# Patient Record
Sex: Female | Born: 1985 | Race: Black or African American | Hispanic: No | Marital: Single | Smoking: Former smoker
Health system: Southern US, Community
[De-identification: ages and names within clinical notes are randomized; demographics above are authoritative.]

## PROBLEM LIST (undated history)

## (undated) ENCOUNTER — Ambulatory Visit (HOSPITAL_COMMUNITY): Admission: EM | Payer: Medicaid Other | Source: Home / Self Care

## (undated) DIAGNOSIS — R519 Headache, unspecified: Secondary | ICD-10-CM

## (undated) DIAGNOSIS — D573 Sickle-cell trait: Secondary | ICD-10-CM

## (undated) DIAGNOSIS — Z5189 Encounter for other specified aftercare: Secondary | ICD-10-CM

## (undated) DIAGNOSIS — B009 Herpesviral infection, unspecified: Secondary | ICD-10-CM

## (undated) HISTORY — PX: NO PAST SURGERIES: SHX2092

---
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
## 2007-11-19 ENCOUNTER — Inpatient Hospital Stay (HOSPITAL_COMMUNITY): Admission: AD | Admit: 2007-11-19 | Discharge: 2007-11-26 | Payer: Self-pay | Admitting: Obstetrics & Gynecology

## 2007-11-19 ENCOUNTER — Ambulatory Visit: Payer: Self-pay | Admitting: Obstetrics & Gynecology

## 2007-11-24 ENCOUNTER — Encounter: Payer: Self-pay | Admitting: Obstetrics & Gynecology

## 2007-11-24 ENCOUNTER — Inpatient Hospital Stay (HOSPITAL_COMMUNITY): Admission: AD | Admit: 2007-11-24 | Discharge: 2007-11-26 | Payer: Self-pay | Admitting: Obstetrics and Gynecology

## 2007-11-24 ENCOUNTER — Ambulatory Visit: Payer: Self-pay | Admitting: Physician Assistant

## 2008-03-20 ENCOUNTER — Encounter: Admission: RE | Admit: 2008-03-20 | Discharge: 2008-03-20 | Payer: Self-pay | Admitting: Family Medicine

## 2008-10-20 ENCOUNTER — Emergency Department (HOSPITAL_COMMUNITY): Admission: EM | Admit: 2008-10-20 | Discharge: 2008-10-20 | Payer: Self-pay | Admitting: Family Medicine

## 2008-12-12 ENCOUNTER — Emergency Department (HOSPITAL_COMMUNITY): Admission: EM | Admit: 2008-12-12 | Discharge: 2008-12-12 | Payer: Self-pay | Admitting: Emergency Medicine

## 2008-12-22 ENCOUNTER — Emergency Department (HOSPITAL_COMMUNITY): Admission: EM | Admit: 2008-12-22 | Discharge: 2008-12-22 | Payer: Self-pay | Admitting: Emergency Medicine

## 2009-03-24 ENCOUNTER — Emergency Department (HOSPITAL_COMMUNITY): Admission: EM | Admit: 2009-03-24 | Discharge: 2009-03-24 | Payer: Self-pay | Admitting: Emergency Medicine

## 2009-07-25 ENCOUNTER — Emergency Department (HOSPITAL_COMMUNITY): Admission: EM | Admit: 2009-07-25 | Discharge: 2009-07-25 | Payer: Self-pay | Admitting: Family Medicine

## 2009-08-09 ENCOUNTER — Emergency Department (HOSPITAL_COMMUNITY): Admission: EM | Admit: 2009-08-09 | Discharge: 2009-08-09 | Payer: Self-pay | Admitting: Family Medicine

## 2009-08-30 ENCOUNTER — Emergency Department (HOSPITAL_COMMUNITY): Admission: EM | Admit: 2009-08-30 | Discharge: 2009-08-30 | Payer: Self-pay | Admitting: Family Medicine

## 2009-10-20 ENCOUNTER — Emergency Department (HOSPITAL_COMMUNITY): Admission: EM | Admit: 2009-10-20 | Discharge: 2009-10-20 | Payer: Self-pay | Admitting: Emergency Medicine

## 2009-10-23 ENCOUNTER — Inpatient Hospital Stay (HOSPITAL_COMMUNITY): Admission: EM | Admit: 2009-10-23 | Discharge: 2009-10-24 | Payer: Self-pay | Admitting: Emergency Medicine

## 2009-11-17 ENCOUNTER — Other Ambulatory Visit: Admission: RE | Admit: 2009-11-17 | Discharge: 2009-11-17 | Payer: Self-pay | Admitting: Family Medicine

## 2009-12-25 ENCOUNTER — Emergency Department (HOSPITAL_BASED_OUTPATIENT_CLINIC_OR_DEPARTMENT_OTHER): Admission: EM | Admit: 2009-12-25 | Discharge: 2009-12-25 | Payer: Self-pay | Admitting: Emergency Medicine

## 2010-04-03 ENCOUNTER — Emergency Department (HOSPITAL_COMMUNITY): Admission: EM | Admit: 2010-04-03 | Discharge: 2010-04-03 | Payer: Self-pay | Admitting: Family Medicine

## 2010-06-16 ENCOUNTER — Emergency Department (HOSPITAL_COMMUNITY)
Admission: EM | Admit: 2010-06-16 | Discharge: 2010-06-16 | Payer: Self-pay | Source: Home / Self Care | Admitting: Family Medicine

## 2010-08-07 ENCOUNTER — Emergency Department (HOSPITAL_COMMUNITY): Payer: Medicaid Other

## 2010-08-07 ENCOUNTER — Encounter (HOSPITAL_COMMUNITY): Payer: Self-pay | Admitting: Radiology

## 2010-08-07 ENCOUNTER — Emergency Department (HOSPITAL_COMMUNITY)
Admission: EM | Admit: 2010-08-07 | Discharge: 2010-08-07 | Disposition: A | Discharge status: Home / Self Care | Payer: Medicaid Other | Attending: Emergency Medicine | Admitting: Emergency Medicine

## 2010-08-07 DIAGNOSIS — R0602 Shortness of breath: Secondary | ICD-10-CM | POA: Insufficient documentation

## 2010-08-07 DIAGNOSIS — R1011 Right upper quadrant pain: Secondary | ICD-10-CM | POA: Insufficient documentation

## 2010-08-07 DIAGNOSIS — N739 Female pelvic inflammatory disease, unspecified: Secondary | ICD-10-CM | POA: Insufficient documentation

## 2010-08-07 DIAGNOSIS — R52 Pain, unspecified: Secondary | ICD-10-CM

## 2010-08-07 DIAGNOSIS — K589 Irritable bowel syndrome without diarrhea: Secondary | ICD-10-CM | POA: Insufficient documentation

## 2010-08-07 DIAGNOSIS — N83209 Unspecified ovarian cyst, unspecified side: Secondary | ICD-10-CM | POA: Insufficient documentation

## 2010-08-07 DIAGNOSIS — M083 Juvenile rheumatoid polyarthritis (seronegative): Secondary | ICD-10-CM | POA: Insufficient documentation

## 2010-08-07 LAB — URINALYSIS, ROUTINE W REFLEX MICROSCOPIC
Glucose, UA: NEGATIVE mg/dL
Protein, ur: NEGATIVE mg/dL
Specific Gravity, Urine: 1.015 (ref 1.005–1.030)
Urobilinogen, UA: 0.2 mg/dL (ref 0.0–1.0)
pH: 7 (ref 5.0–8.0)

## 2010-08-07 LAB — DIFFERENTIAL
Basophils Absolute: 0 10*3/uL (ref 0.0–0.1)
Basophils Relative: 0 % (ref 0–1)
Eosinophils Absolute: 0 10*3/uL (ref 0.0–0.7)
Neutro Abs: 6.6 10*3/uL (ref 1.7–7.7)
Neutrophils Relative %: 74 % (ref 43–77)

## 2010-08-07 LAB — COMPREHENSIVE METABOLIC PANEL
ALT: <8 U/L (ref 0–35)
AST: 24 U/L (ref 0–37)
BUN: 7 mg/dL (ref 6–23)
CO2: 27 mEq/L (ref 19–32)
Chloride: 104 mEq/L (ref 96–112)
Creatinine, Ser: 0.61 mg/dL (ref 0.4–1.2)
GFR calc Af Amer: >60 mL/min (ref >60)
Sodium: 138 mEq/L (ref 135–145)
Total Protein: 7.3 g/dL (ref 6.0–8.3)

## 2010-08-07 LAB — CBC
HCT: 35 % — ABNORMAL LOW (ref 36.0–46.0)
MCH: 29.9 pg (ref 26.0–34.0)
MCHC: 34.3 g/dL (ref 30.0–36.0)
Platelets: 222 10*3/uL (ref 150–400)
RBC: 4.01 MIL/uL (ref 3.87–5.11)
RDW: 13 % (ref 11.5–15.5)

## 2010-08-07 LAB — URINE MICROSCOPIC-ADD ON

## 2010-08-07 LAB — POCT PREGNANCY, URINE: Preg Test, Ur: NEGATIVE

## 2010-08-07 LAB — LIPASE, BLOOD: Lipase: 25 U/L (ref 11–59)

## 2010-08-07 MED ORDER — IOHEXOL 300 MG/ML  SOLN: 80.0000 mL | Freq: Once | INTRAMUSCULAR | Status: DC | PRN

## 2010-08-11 LAB — POCT URINALYSIS DIPSTICK
Nitrite: NEGATIVE
Specific Gravity, Urine: 1.015 (ref 1.005–1.030)
Urobilinogen, UA: 0.2 mg/dL (ref 0.0–1.0)
pH: 7 (ref 5.0–8.0)

## 2010-08-17 LAB — CBC
HCT: 36.9 % (ref 36.0–46.0)
MCHC: 33.2 g/dL (ref 30.0–36.0)
MCHC: 33.7 g/dL (ref 30.0–36.0)
MCV: 90.9 fL (ref 78.0–100.0)
MCV: 91 fL (ref 78.0–100.0)
Platelets: 191 10*3/uL (ref 150–400)
RBC: 3.46 MIL/uL — ABNORMAL LOW (ref 3.87–5.11)
RBC: 4.05 MIL/uL (ref 3.87–5.11)
RDW: 14.2 % (ref 11.5–15.5)
WBC: 10 10*3/uL (ref 4.0–10.5)
WBC: 9.4 10*3/uL (ref 4.0–10.5)

## 2010-08-17 LAB — DIFFERENTIAL
Basophils Relative: 0 % (ref 0–1)
Eosinophils Absolute: 0 10*3/uL (ref 0.0–0.7)
Eosinophils Relative: 0 % (ref 0–5)
Lymphocytes Relative: 18 % (ref 12–46)
Lymphs Abs: 1 10*3/uL (ref 0.7–4.0)
Lymphs Abs: 1.4 10*3/uL (ref 0.7–4.0)
Monocytes Absolute: 0.4 10*3/uL (ref 0.1–1.0)
Monocytes Relative: 5 % (ref 3–12)
Monocytes Relative: 9 % (ref 3–12)
Neutro Abs: 5.9 10*3/uL (ref 1.7–7.7)
Neutrophils Relative %: 76 % (ref 43–77)

## 2010-08-17 LAB — COMPREHENSIVE METABOLIC PANEL
Albumin: 3.5 g/dL (ref 3.5–5.2)
BUN: 9 mg/dL (ref 6–23)
Calcium: 9.1 mg/dL (ref 8.4–10.5)
Creatinine, Ser: 0.67 mg/dL (ref 0.4–1.2)
Glucose, Bld: 90 mg/dL (ref 70–99)
Total Protein: 7.2 g/dL (ref 6.0–8.3)

## 2010-08-17 LAB — BASIC METABOLIC PANEL
Calcium: 8.5 mg/dL (ref 8.4–10.5)
Chloride: 109 mEq/L (ref 96–112)
Creatinine, Ser: 0.57 mg/dL (ref 0.4–1.2)
GFR calc Af Amer: >60 mL/min (ref >60)
GFR calc Af Amer: >60 mL/min (ref >60)
GFR calc non Af Amer: >60 mL/min (ref >60)
Sodium: 140 mEq/L (ref 135–145)

## 2010-08-17 LAB — CULTURE, BLOOD (ROUTINE X 2): Culture: NO GROWTH

## 2010-08-17 LAB — HEPATIC FUNCTION PANEL
ALT: 12 U/L (ref 0–35)
AST: 16 U/L (ref 0–37)
Albumin: 2.6 g/dL — ABNORMAL LOW (ref 3.5–5.2)
Alkaline Phosphatase: 49 U/L (ref 39–117)
Indirect Bilirubin: 0.3 mg/dL (ref 0.3–0.9)
Total Protein: 6.1 g/dL (ref 6.0–8.3)

## 2010-08-17 LAB — WET PREP, GENITAL
Trich, Wet Prep: NONE SEEN
Yeast Wet Prep HPF POC: NONE SEEN

## 2010-08-17 LAB — URINALYSIS, ROUTINE W REFLEX MICROSCOPIC
Bilirubin Urine: NEGATIVE
Glucose, UA: NEGATIVE mg/dL
Glucose, UA: NEGATIVE mg/dL
Ketones, ur: NEGATIVE mg/dL
Ketones, ur: NEGATIVE mg/dL
Nitrite: NEGATIVE
Specific Gravity, Urine: 1.018 (ref 1.005–1.030)
pH: 6 (ref 5.0–8.0)
pH: 6.5 (ref 5.0–8.0)

## 2010-08-17 LAB — URINE MICROSCOPIC-ADD ON

## 2010-08-17 LAB — POCT I-STAT, CHEM 8
BUN: 3 mg/dL — ABNORMAL LOW (ref 6–23)
Calcium, Ion: 1.15 mmol/L (ref 1.12–1.32)
Creatinine, Ser: 0.7 mg/dL (ref 0.4–1.2)
Sodium: 139 mEq/L (ref 135–145)
TCO2: 27 mmol/L (ref 0–100)

## 2010-08-17 LAB — D-DIMER, QUANTITATIVE: D-Dimer, Quant: 4.21 ug/mL-FEU — ABNORMAL HIGH (ref 0.00–0.48)

## 2010-08-17 LAB — RAPID URINE DRUG SCREEN, HOSP PERFORMED
Barbiturates: NOT DETECTED
Cocaine: NOT DETECTED

## 2010-08-17 LAB — URINE CULTURE: Culture: NO GROWTH

## 2010-08-17 LAB — PREGNANCY, URINE: Preg Test, Ur: NEGATIVE

## 2010-08-19 LAB — POCT URINALYSIS DIP (DEVICE)
Bilirubin Urine: NEGATIVE
Hgb urine dipstick: NEGATIVE
Ketones, ur: NEGATIVE mg/dL
Protein, ur: 30 mg/dL — AB
Specific Gravity, Urine: 1.01 (ref 1.005–1.030)
pH: 8.5 — ABNORMAL HIGH (ref 5.0–8.0)

## 2010-08-23 LAB — CBC
HCT: 38.8 % (ref 36.0–46.0)
Hemoglobin: 13.1 g/dL (ref 12.0–15.0)
MCV: 91.7 fL (ref 78.0–100.0)
RBC: 4.22 MIL/uL (ref 3.87–5.11)
WBC: 6.5 10*3/uL (ref 4.0–10.5)

## 2010-08-23 LAB — DIFFERENTIAL
Eosinophils Absolute: 0.1 10*3/uL (ref 0.0–0.7)
Eosinophils Relative: 1 % (ref 0–5)
Lymphs Abs: 1.5 10*3/uL (ref 0.7–4.0)
Monocytes Absolute: 0.6 10*3/uL (ref 0.1–1.0)
Monocytes Relative: 10 % (ref 3–12)
Neutrophils Relative %: 66 % (ref 43–77)

## 2010-09-03 LAB — URINALYSIS, ROUTINE W REFLEX MICROSCOPIC
Bilirubin Urine: NEGATIVE
Glucose, UA: NEGATIVE mg/dL
Protein, ur: NEGATIVE mg/dL

## 2010-09-03 LAB — URINE CULTURE

## 2010-09-03 LAB — URINE MICROSCOPIC-ADD ON

## 2010-09-03 LAB — POCT PREGNANCY, URINE: Preg Test, Ur: NEGATIVE

## 2010-09-06 LAB — URINALYSIS, ROUTINE W REFLEX MICROSCOPIC
Bilirubin Urine: NEGATIVE
Glucose, UA: NEGATIVE mg/dL
Ketones, ur: NEGATIVE mg/dL
Nitrite: NEGATIVE
Protein, ur: NEGATIVE mg/dL
Specific Gravity, Urine: 1.013 (ref 1.005–1.030)
Urobilinogen, UA: 0.2 mg/dL (ref 0.0–1.0)
pH: 6.5 (ref 5.0–8.0)

## 2010-09-06 LAB — WET PREP, GENITAL
Trich, Wet Prep: NONE SEEN
Yeast Wet Prep HPF POC: NONE SEEN

## 2010-09-06 LAB — RPR: RPR Ser Ql: NONREACTIVE

## 2010-09-06 LAB — DIFFERENTIAL
Basophils Absolute: 0.1 10*3/uL (ref 0.0–0.1)
Basophils Relative: 1 % (ref 0–1)
Eosinophils Absolute: 0 10*3/uL (ref 0.0–0.7)
Monocytes Absolute: 0.5 10*3/uL (ref 0.1–1.0)
Neutro Abs: 9.9 10*3/uL — ABNORMAL HIGH (ref 1.7–7.7)
Neutrophils Relative %: 89 % — ABNORMAL HIGH (ref 43–77)

## 2010-09-06 LAB — COMPREHENSIVE METABOLIC PANEL
Albumin: 3.7 g/dL (ref 3.5–5.2)
Alkaline Phosphatase: 39 U/L (ref 39–117)
BUN: 7 mg/dL (ref 6–23)
Chloride: 109 mEq/L (ref 96–112)
Glucose, Bld: 104 mg/dL — ABNORMAL HIGH (ref 70–99)
Potassium: 4.3 mEq/L (ref 3.5–5.1)
Total Bilirubin: 0.5 mg/dL (ref 0.3–1.2)

## 2010-09-06 LAB — GC/CHLAMYDIA PROBE AMP, GENITAL
Chlamydia, DNA Probe: NEGATIVE
GC Probe Amp, Genital: NEGATIVE

## 2010-09-06 LAB — CBC
HCT: 38.8 % (ref 36.0–46.0)
Hemoglobin: 13.2 g/dL (ref 12.0–15.0)
WBC: 11.2 10*3/uL — ABNORMAL HIGH (ref 4.0–10.5)

## 2010-09-06 LAB — URINE MICROSCOPIC-ADD ON

## 2010-09-06 LAB — POCT PREGNANCY, URINE: Preg Test, Ur: NEGATIVE

## 2010-09-08 LAB — POCT PREGNANCY, URINE: Preg Test, Ur: NEGATIVE

## 2010-09-08 LAB — HERPES SIMPLEX VIRUS CULTURE: Culture: DETECTED

## 2010-09-08 LAB — POCT URINALYSIS DIP (DEVICE)
Hgb urine dipstick: NEGATIVE
Protein, ur: NEGATIVE mg/dL
Specific Gravity, Urine: 1.015 (ref 1.005–1.030)
Urobilinogen, UA: 0.2 mg/dL (ref 0.0–1.0)
pH: 8.5 — ABNORMAL HIGH (ref 5.0–8.0)

## 2010-09-08 LAB — URINE CULTURE

## 2011-02-25 LAB — CBC
Hemoglobin: 10.4 — ABNORMAL LOW
MCHC: 34.3
Platelets: 160
RBC: 4.14
RDW: 13.9
WBC: 14.5 — ABNORMAL HIGH

## 2011-02-25 LAB — STREP B DNA PROBE: Strep Group B Ag: NEGATIVE

## 2011-02-25 LAB — RAPID HIV SCREEN (WH-MAU): Rapid HIV Screen: NONREACTIVE

## 2011-02-25 LAB — RPR: RPR Ser Ql: NONREACTIVE

## 2011-03-11 IMAGING — CT CT ABD-PELV W/ CM
1 of 9 series · 14 of 37 positions shown, 18 images · IV contrast (APPLIED)
Comparison: Ultrasound of abdomen of 08/07/2010.

CLINICAL DATA: Abdominal pain.  Nausea.  Joint pain.  History of
sickle cell anemia.  Rheumatoid arthritis.

CT ANGIOGRAPHY OF THE CHEST
TECHNIQUE: Multidetector CT angiography of the chest was performed
after contrast with bolus timed to evaluate the pulmonary arteries.
Multiplanar CT image reconstructions including MIPs were obtained
to evaluate the vascular anatomy.
Contrast:  80 ml Umnipaque-EVV
TECHNIQUE: Multidetector CT imaging of the abdomen and pelvis was
performed following the standard protocol following the bolus
administration of intravenous contrast.

[Series 8: pulm embolism 1.0 b25f thins · axial · 0.54mm/px · z∈[-198,-12]mm · 14 of 216 slices shown, 18 images]
[im 15/216  mediastinal]
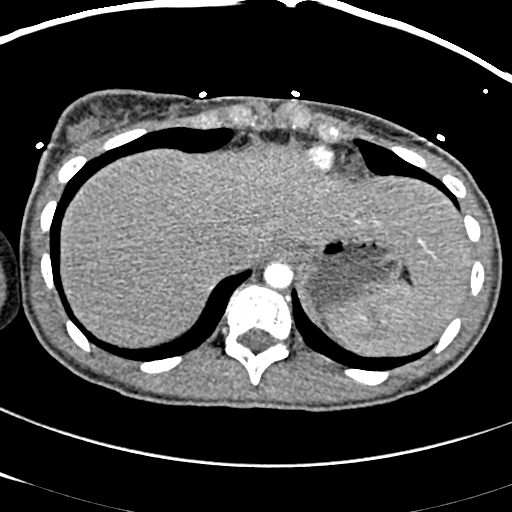
[im 15/216  lung]
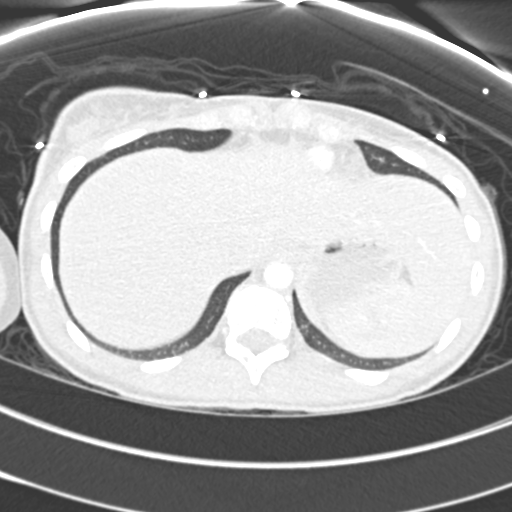
[im 29/216  lung]
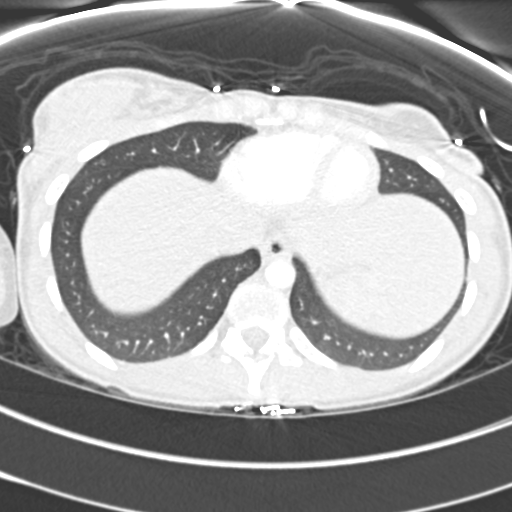
[im 44/216  lung]
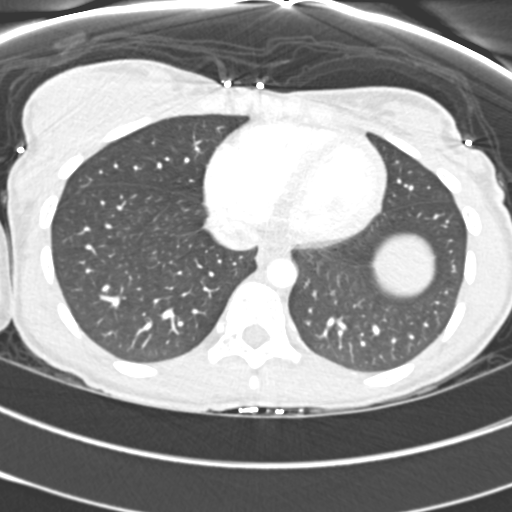
[im 58/216  lung]
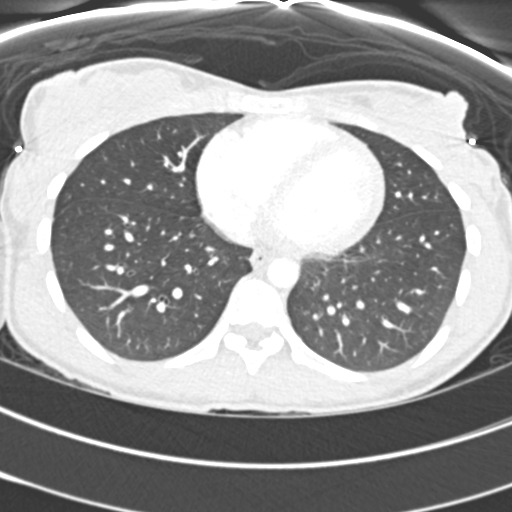
[im 72/216  mediastinal]
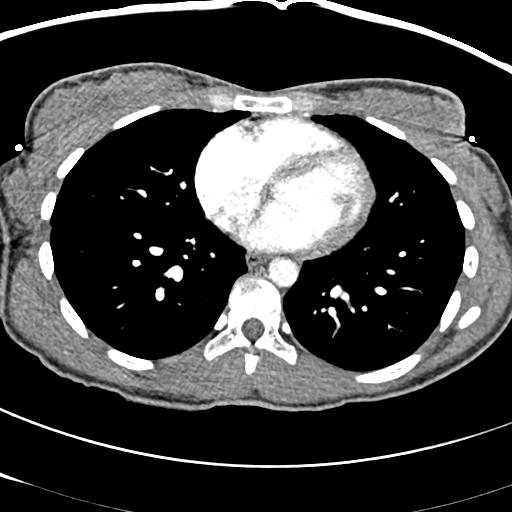
[im 72/216  lung]
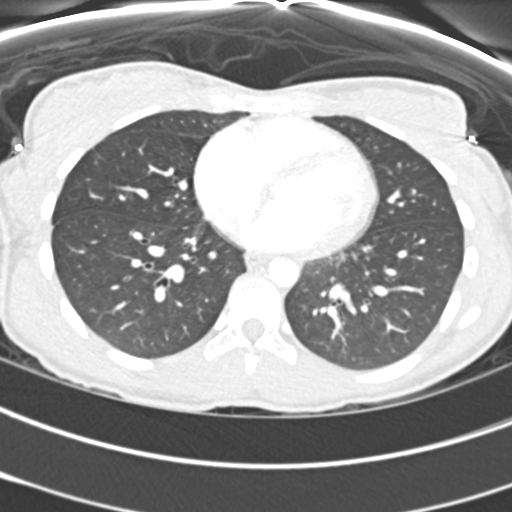
[im 87/216  lung]
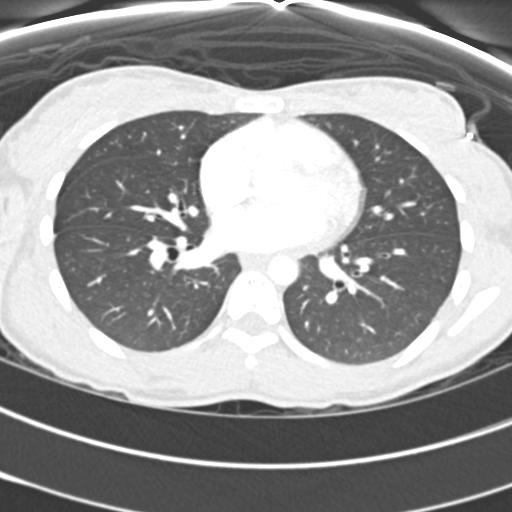
[im 101/216  lung]
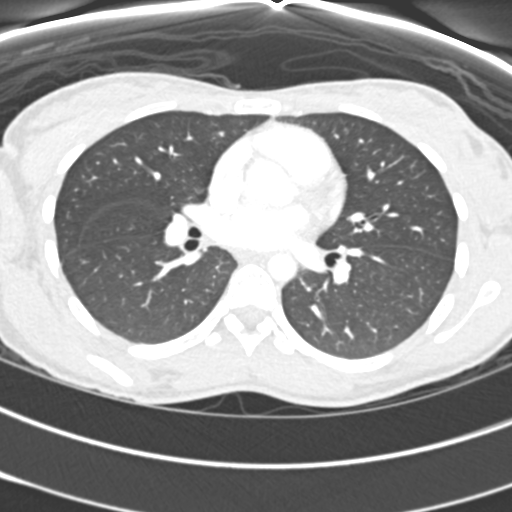
[im 115/216  lung]
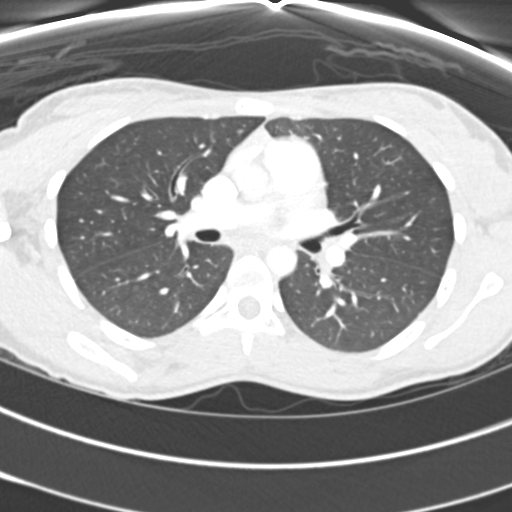
[im 130/216  mediastinal]
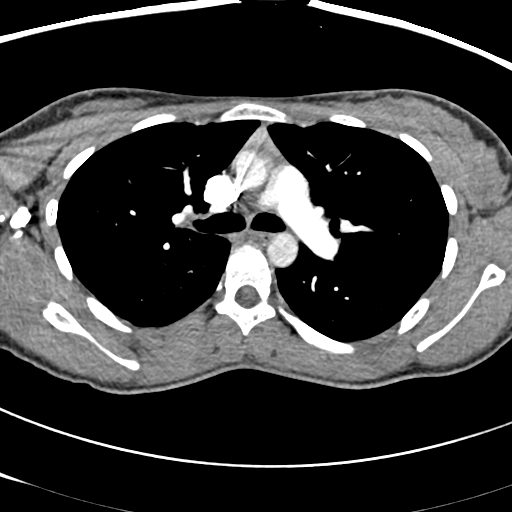
[im 130/216  lung]
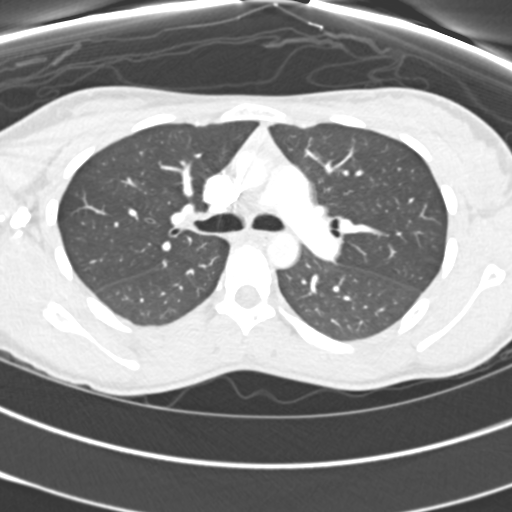
[im 144/216  lung]
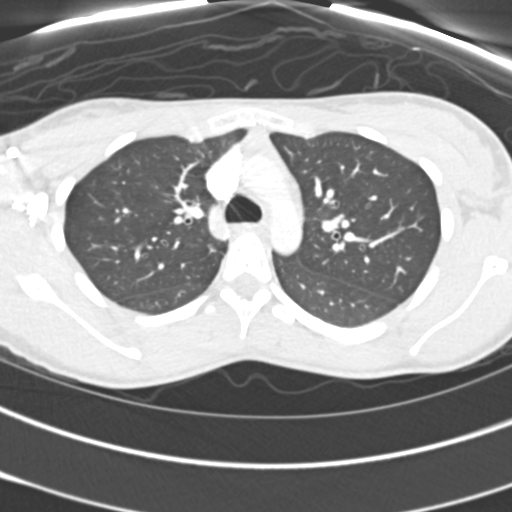
[im 158/216  lung]
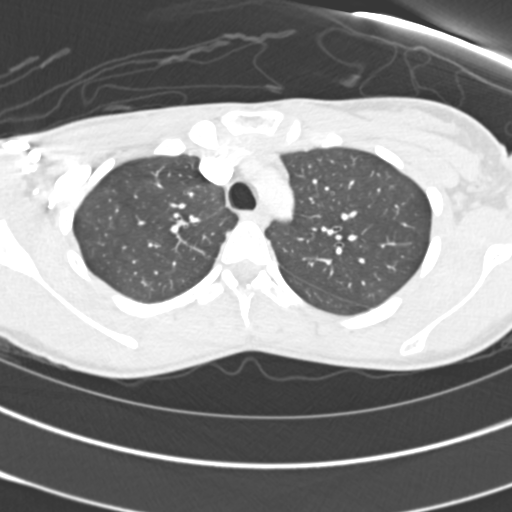
[im 173/216  lung]
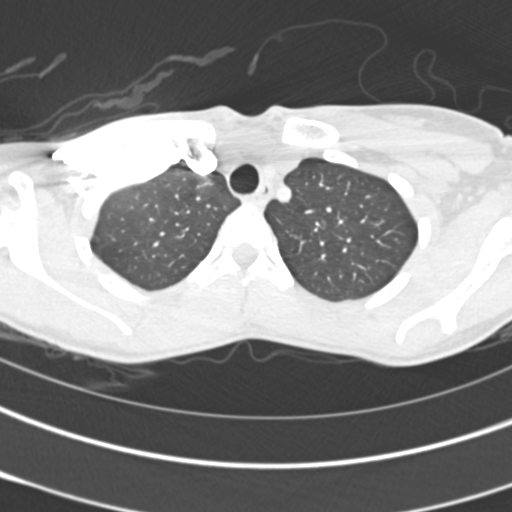
[im 187/216  mediastinal]
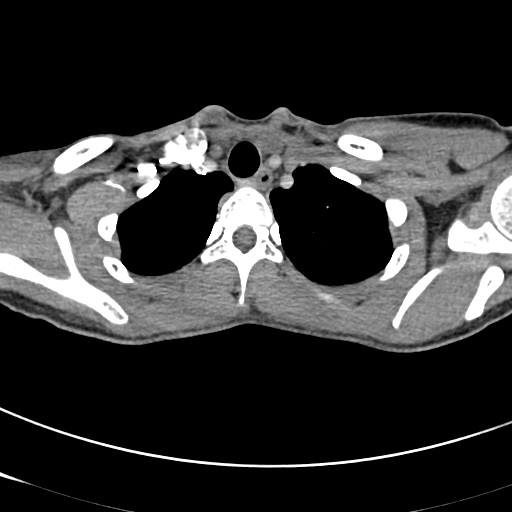
[im 187/216  lung]
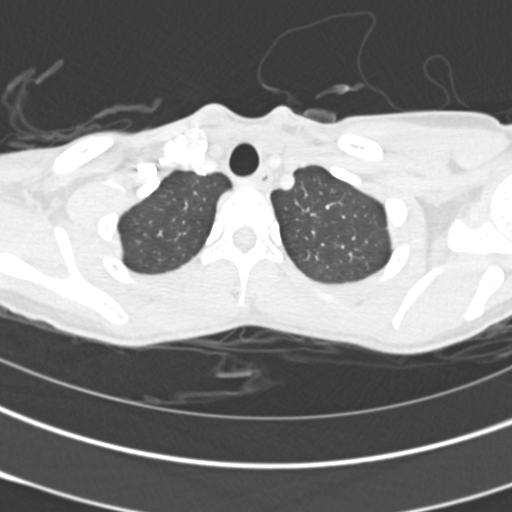
[im 201/216  lung]
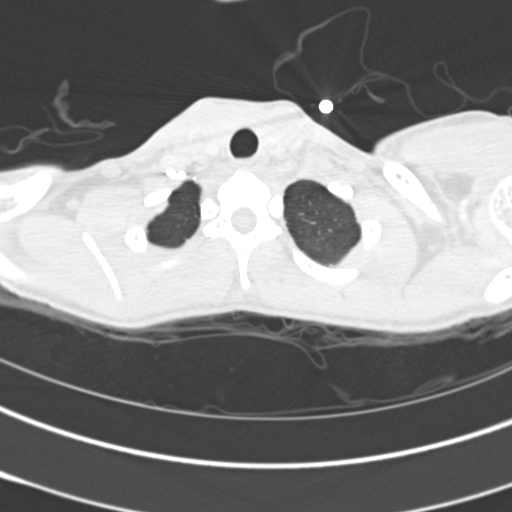

[14 of 37 positions shown; findings below may reference images not displayed]

Plain film chest
08/07/2010.  Chest CT 10/23/2009.  Abdominal pelvic CT 10/20/2009.
MRCP 10/23/2009.
FINDINGS: Lung windows demonstrate no nodules or airspace
opacities.

Soft tissue windows:  The quality of this exam for evaluation of
pulmonary embolism is good to excellent. No evidence of pulmonary
embolism.

Normal aortic caliber without dissection.  Normal heart size
without pericardial or pleural effusion.  No mediastinal or hilar
adenopathy.  Residual thymus in the anterior mediastinum. No acute
osseous abnormality.

 Review of the MIP images confirms the above findings.
IMPRESSION: 1. No evidence of pulmonary embolism.
2. No acute process in the chest.

CT ABDOMEN AND PELVIS WITH CONTRAST
FINDINGS: Mildly degraded by patient arm positioning.  Not raised
above the head.  Variant lateral segment left liver lobe extending
into the left upper quadrant.  Normal spleen, stomach, pancreas,
gallbladder, biliary tract, adrenal glands.  Early contrast
excretion within bilateral kidneys. No retroperitoneal or
retrocrural adenopathy.

Normal caliber of the colon.  Normal terminal ileum.  The appendix
is not well evaluated.  Poor enteric contrast opacification.

Normal caliber of small bowel loops. No pelvic adenopathy.  Normal
urinary bladder and uterus.  A right ovarian corpus luteal cyst
measures 1.9 cm on image 68.  There is soft tissue fullness within
the left hemi pelvis on image 61.  This is positioned immediately
inferior to the sigmoid colon.  This is favored to be related to
the left ovary.  There is mild edema within the anterior left
pelvic fat.  Example image 61.  This is new since the prior CT.

Trace free pelvic fluid is likely physiologic.  No acute osseous
abnormality.
IMPRESSION: 1.  Right ovarian corpus luteal cyst.
2.  Nonspecific edema and soft tissue fullness within the left hemi
pelvis.  Favor inflammation, as can be seen with pelvic
inflammatory disease.  Sigmoid diverticulitis is felt less likely
but could look similar.  Correlate with risk factors for pelvic
inflammatory disease and consider physical exam correlation.
3.  Suboptimal visualization of the appendix due to technique
related factors.

## 2011-05-04 ENCOUNTER — Emergency Department (HOSPITAL_COMMUNITY)
Admission: EM | Admit: 2011-05-04 | Discharge: 2011-05-04 | Discharge status: Against Medical Advice | Payer: Self-pay | Attending: Emergency Medicine | Admitting: Emergency Medicine

## 2011-05-04 ENCOUNTER — Encounter (HOSPITAL_COMMUNITY): Payer: Self-pay | Admitting: *Deleted

## 2011-05-04 DIAGNOSIS — R197 Diarrhea, unspecified: Secondary | ICD-10-CM | POA: Insufficient documentation

## 2011-05-04 DIAGNOSIS — M549 Dorsalgia, unspecified: Secondary | ICD-10-CM | POA: Insufficient documentation

## 2011-05-04 DIAGNOSIS — R112 Nausea with vomiting, unspecified: Secondary | ICD-10-CM | POA: Insufficient documentation

## 2011-05-04 DIAGNOSIS — R6889 Other general symptoms and signs: Secondary | ICD-10-CM | POA: Insufficient documentation

## 2011-05-04 DIAGNOSIS — R52 Pain, unspecified: Secondary | ICD-10-CM | POA: Insufficient documentation

## 2011-05-04 NOTE — ED Notes (Signed)
Pt states "I started with a tickle in my throat on Sunday, but I hurt all over, my back hurts, n/v/d."

## 2011-05-04 NOTE — ED Notes (Signed)
Pt does not want to wait for a room, left before signing AMA, pt was encouraged to wait for [facility] room

## 2011-11-28 ENCOUNTER — Emergency Department (HOSPITAL_COMMUNITY)
Admission: EM | Admit: 2011-11-28 | Discharge: 2011-11-29 | Disposition: A | Discharge status: Home / Self Care | Payer: Medicaid Other | Attending: Emergency Medicine | Admitting: Emergency Medicine

## 2011-11-28 ENCOUNTER — Encounter (HOSPITAL_COMMUNITY): Payer: Self-pay | Admitting: Emergency Medicine

## 2011-11-28 DIAGNOSIS — F17200 Nicotine dependence, unspecified, uncomplicated: Secondary | ICD-10-CM | POA: Insufficient documentation

## 2011-11-28 DIAGNOSIS — G43909 Migraine, unspecified, not intractable, without status migrainosus: Secondary | ICD-10-CM

## 2011-11-28 HISTORY — DX: Sickle-cell trait: D57.3

## 2011-11-28 MED ORDER — DIPHENHYDRAMINE HCL 50 MG/ML IJ SOLN
25.0000 mg | Freq: Once | INTRAMUSCULAR | Status: AC
  Administered 2011-11-28: 25 mg via INTRAVENOUS
  Filled 2011-11-28: qty 1

## 2011-11-28 MED ORDER — SUMATRIPTAN SUCCINATE 50 MG PO TABS: 50.0000 mg | ORAL_TABLET | ORAL | Status: DC | PRN

## 2011-11-28 MED ORDER — METOCLOPRAMIDE HCL 5 MG/ML IJ SOLN
10.0000 mg | Freq: Once | INTRAMUSCULAR | Status: AC
  Administered 2011-11-28: 10 mg via INTRAVENOUS
  Filled 2011-11-28: qty 2

## 2011-11-28 MED ORDER — KETOROLAC TROMETHAMINE 30 MG/ML IJ SOLN
30.0000 mg | Freq: Once | INTRAMUSCULAR | Status: AC
Start: 2011-11-28 — End: 2011-11-28
  Administered 2011-11-28: 30 mg via INTRAVENOUS
  Filled 2011-11-28: qty 1

## 2011-11-28 NOTE — ED Notes (Signed)
Pt from home with c/o n/v/d x 3 days. Patient sts that she had a headache on Friday that has progressed to a migraine. Sts N/V/D began Friday as well.

## 2011-11-28 NOTE — Discharge Instructions (Signed)
Migraine Headache  A migraine is very bad pain on one or both sides of your head. The cause of a migraine is not always known. A migraine can be triggered or caused by different things, such as:   Alcohol.   Smoking.   Stress.   Periods (menstruation) in women.   Aged cheeses.   Foods or drinks that contain nitrates, glutamate, aspartame, or tyramine.   Lack of sleep.   Chocolate.   Caffeine.   Hunger.   Medicines, such as nitroglycerine (used to treat chest pain), birth control pills, estrogen, and some blood pressure medicines.  HOME CARE   Many medicines can help migraine pain or keep migraines from coming back. Your doctor can help you decide on a medicine or treatment program.   If you or your child gets a migraine, it may help to lie down in a dark, quiet room.   Keep a headache journal. This may help find out what is causing the headaches. For example, write down:   What you eat and drink.   How much sleep you get.   Any change to your diet or medicines.  GET HELP RIGHT AWAY IF:    The medicine does not work.   The pain begins again.   The neck is stiff.   You have trouble seeing.   The muscles are weak or you lose muscle control.   You have new symptoms.   You lose your balance.   You have trouble walking.   You feel faint or pass out.  MAKE SURE YOU:    Understand these instructions.   Will watch this condition.   Will get help right away if you are not doing well or get worse.  Document Released: 02/24/2008 Document Revised: 05/06/2011 Document Reviewed: 01/20/2009  [employer] Patient Information 2012 [employer].

## 2011-11-28 NOTE — ED Provider Notes (Signed)
History     CSN: [id number] Arrival date & time 11/28/11  2134 First MD Initiated Contact with Patient 11/28/11 2227      Chief Complaint  Patient presents with  . Nausea  . Emesis  . Headache  . Diarrhea    Patient is a 26 y.o. female presenting with headaches and diarrhea. The history is provided by the patient.  Headache  The current episode started 2 days ago. The problem occurs constantly. Progression since onset: waxing and waning. The headache is associated with bright light. The pain is located in the left unilateral region. The quality of the pain is described as dull. The pain is at a severity of 10/10. The pain is moderate. The pain does not radiate. Associated symptoms include nausea and vomiting. Pertinent negatives include no fever. Associated symptoms comments: No neck pain. She has tried acetaminophen, aspirin, resting in a darkened room and NSAIDs for the symptoms. The treatment provided no relief.  Diarrhea The primary symptoms include nausea and vomiting. Primary symptoms do not include fever.  No abdominal pain.  She has had a couple of loose stools.  SHe has also been vomiting.  Past Medical History  Diagnosis Date  . Sickle cell trait     History reviewed. No pertinent past surgical history.  History reviewed. No pertinent family history. mother has migraine headache  History  Substance Use Topics  . Smoking status: Current Some Day Smoker -- 0.0 packs/day    Types: Cigarettes  . Smokeless tobacco: Not on file  . Alcohol Use: No    OB History    Grav Para Term Preterm Abortions TAB SAB Ect Mult Living                  Review of Systems  Constitutional: Negative for fever.  Gastrointestinal: Positive for nausea and vomiting.  Neurological: Positive for headaches.  All other systems reviewed and are negative.    Allergies  Review of patient's allergies indicates no known allergies.  Home Medications  No current outpatient prescriptions on  file.  BP 99/52  Pulse 83  Temp 98.6 F (37 C) (Oral)  Resp 18  SpO2 100%  LMP 10/28/2011  Physical Exam  Nursing note and vitals reviewed. Constitutional: She appears well-developed and well-nourished. No distress.  HENT:  Head: Normocephalic and atraumatic.  Right Ear: External ear normal.  Left Ear: External ear normal.       Photophobia  Eyes: Conjunctivae are normal. Right eye exhibits no discharge. Left eye exhibits no discharge. No scleral icterus.  Neck: Neck supple. No tracheal deviation present.       No meningeal signs  Cardiovascular: Normal rate, regular rhythm and intact distal pulses.   Pulmonary/Chest: Effort normal and breath sounds normal. No stridor. No respiratory distress. She has no wheezes. She has no rales.  Abdominal: Soft. Bowel sounds are normal. She exhibits no distension. There is no tenderness. There is no rebound and no guarding.  Musculoskeletal: She exhibits no edema and no tenderness.  Neurological: She is alert. She has normal strength. No sensory deficit. Cranial nerve deficit:  no gross defecits noted. She exhibits normal muscle tone. She displays no seizure activity. Coordination normal.  Skin: Skin is warm and dry. No rash noted.  Psychiatric: She has a normal mood and affect.    ED Course  Procedures (including critical care time)  Labs Reviewed - No data to display No results found.    MDM  Patient's symptoms are suggestive of  migraine headache. She has a strong family history. She will be treated with reglan, benadryl and toradol.  11:51 PM Pt is feeling much better after treatment.  Symptoms consistent with a migraine headache.  Will dc home with prescription for imitrex.        Celene Kras, MD 11/28/11 [phone]

## 2011-11-28 NOTE — ED Notes (Signed)
[  patient states that her mother has a history of MHa The patient reports N/V/D with the HA

## 2011-11-29 NOTE — ED Notes (Signed)
Patient given discharge instructions, information, prescriptions, and diet order. Patient states that they adequately understand discharge information given and to return to ED if symptoms return or worsen.     

## 2012-03-14 ENCOUNTER — Emergency Department (HOSPITAL_COMMUNITY)
Admission: EM | Admit: 2012-03-14 | Discharge: 2012-03-15 | Disposition: A | Discharge status: Home / Self Care | Payer: Medicaid Other | Attending: Emergency Medicine | Admitting: Emergency Medicine

## 2012-03-14 DIAGNOSIS — F17200 Nicotine dependence, unspecified, uncomplicated: Secondary | ICD-10-CM | POA: Insufficient documentation

## 2012-03-14 DIAGNOSIS — S46219A Strain of muscle, fascia and tendon of other parts of biceps, unspecified arm, initial encounter: Secondary | ICD-10-CM

## 2012-03-14 DIAGNOSIS — D573 Sickle-cell trait: Secondary | ICD-10-CM | POA: Insufficient documentation

## 2012-03-14 DIAGNOSIS — M79609 Pain in unspecified limb: Secondary | ICD-10-CM | POA: Insufficient documentation

## 2012-03-15 ENCOUNTER — Encounter (HOSPITAL_COMMUNITY): Payer: Self-pay | Admitting: *Deleted

## 2012-03-15 MED ORDER — HYDROCODONE-ACETAMINOPHEN 5-325 MG PO TABS: 1.0000 | ORAL_TABLET | Freq: Four times a day (QID) | ORAL | Status: DC | PRN

## 2012-03-15 NOTE — ED Notes (Signed)
Pt refused Ibuprofen offered; pt states that she has taken Tylenol and Ibuprofen but it did not do any good.

## 2012-03-15 NOTE — ED Notes (Signed)
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  Since then has had left arm pain; had to come off of stage tonight because of arm pain while doing tricks; no obvious injury

## 2012-03-15 NOTE — ED Provider Notes (Signed)
History     CSN: [id number]  Arrival date & time 03/14/12  2351   First MD Initiated Contact with Patient 03/15/12 [phone]      Chief Complaint  Patient presents with  . 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 The pain is located primarily on the medial aspect of the left bicep. There is no deformity, erythema, ecchymosis, motor or sensory deficit associated with it. There is some relief with application of pressure. She's been taking ibuprofen without adequate relief of the pain.  Past Medical History  Diagnosis Date  . Sickle cell trait     History reviewed. No pertinent past surgical history.  No family history on file.  History  Substance Use Topics  . Smoking status: Current Some Day Smoker -- 1.0 packs/day    Types: Cigarettes  . Smokeless tobacco: Not on file  . Alcohol Use: No    OB History    Grav Para Term Preterm Abortions TAB SAB Ect Mult Living                  Review of Systems  All other systems reviewed and are negative.    Allergies  Review of patient's allergies indicates no known allergies.  Home Medications   Current Outpatient Rx  Name Route Sig Dispense Refill  . SUMATRIPTAN SUCCINATE 50 MG PO TABS Oral Take 1 tablet (50 mg total) by mouth every 2 (two) hours as needed for migraine. May repeat the 50 mg dose once per headache, max 200 mg per 24 hours 10 tablet 0    BP 106/62  Pulse 90  Temp 98.4 F (36.9 C)  Resp 20  SpO2 99%  LMP 03/15/2012  Physical Exam General: Well-developed, well-nourished female in no acute distress; appearance consistent with age of record HENT: normocephalic, atraumatic Eyes: Normal appearance Neck: supple Heart: regular rate and rhythm Lungs:  Normal respiratory effort and excursion Abdomen: soft; nondistended Extremities: No deformity; full range of motion; pulses normal; tenderness of left medial bicep without palpable spasm, no erythema or ecchymosis seen, no tendon deficit present, left upper extremity is distally neurovascularly intact Neurologic: Awake, alert and oriented; motor function intact in all extremities and symmetric; no facial droop Skin: Warm and dry Psychiatric: Normal mood and affect    ED Course  Procedures (including critical care time)     MDMPatient was advised that she needs to rest her left arm until the bicep can have a chance to heal.          Hanley Seamen, MD 03/15/12 [phone]

## 2012-06-19 ENCOUNTER — Emergency Department (HOSPITAL_COMMUNITY)
Admission: EM | Admit: 2012-06-19 | Discharge: 2012-06-19 | Disposition: A | Discharge status: Home / Self Care | Payer: Medicaid Other | Attending: Emergency Medicine | Admitting: Emergency Medicine

## 2012-06-19 ENCOUNTER — Encounter (HOSPITAL_COMMUNITY): Payer: Self-pay | Admitting: Emergency Medicine

## 2012-06-19 ENCOUNTER — Emergency Department (HOSPITAL_COMMUNITY): Payer: Medicaid Other

## 2012-06-19 DIAGNOSIS — F17200 Nicotine dependence, unspecified, uncomplicated: Secondary | ICD-10-CM | POA: Insufficient documentation

## 2012-06-19 DIAGNOSIS — M6580 Other synovitis and tenosynovitis, unspecified site: Secondary | ICD-10-CM | POA: Insufficient documentation

## 2012-06-19 DIAGNOSIS — D573 Sickle-cell trait: Secondary | ICD-10-CM | POA: Insufficient documentation

## 2012-06-19 DIAGNOSIS — M778 Other enthesopathies, not elsewhere classified: Secondary | ICD-10-CM

## 2012-06-19 MED ORDER — IBUPROFEN 600 MG PO TABS: 600.0000 mg | ORAL_TABLET | Freq: Four times a day (QID) | ORAL | Status: DC | PRN

## 2012-06-19 MED ORDER — OXYCODONE-ACETAMINOPHEN 5-325 MG PO TABS
1.0000 | ORAL_TABLET | Freq: Once | ORAL | Status: AC
  Administered 2012-06-19: 1 via ORAL
  Filled 2012-06-19: qty 1

## 2012-06-19 NOTE — ED Provider Notes (Signed)
Medical screening examination/treatment/procedure(s) were performed by non-physician practitioner and as supervising physician I was immediately available for consultation/collaboration.   Lachelle Rissler, MD 06/19/12 2300 

## 2012-06-19 NOTE — ED Notes (Signed)
Pt reports left elbow pain since last night.  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

## 2012-06-19 NOTE — ED Provider Notes (Signed)
History  This chart was scribed for non-physician practitioner working with Glynn Octave, MD by Erskine Emery, ED Scribe. This patient was seen in room WTR9/WTR9 and the patient's care was started at 15:07.    CSN: [id number]  Arrival date & time 06/19/12  1354   First MD Initiated Contact with Patient 06/19/12 1507      Chief Complaint  Patient presents with  . Elbow Pain    (Consider location/radiation/quality/duration/timing/severity/associated sxs/prior treatment) The history is provided by the patient. No language interpreter was used.  Teresa Burns is a 27 y.o. female who presents to the Emergency Department complaining of a swelling, aching, pinching and jumping left elbow pain with no radiation since last night. 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 She reports she had similar symptoms 2-3 months ago after doing aggressive dancing tricks. She reoports the pain is aggravated by pulling up on the pole. She denies any associated numbness, tingling, or any cold symptoms.  Pt is right-handed. She can move her arm, with pain. Denies fever, chills, rash, swelling.  Pain is non radiating.  No elbow or wrist pain.    Past Medical History  Diagnosis Date  . Sickle cell trait     History reviewed. No pertinent past surgical history.  History reviewed. No pertinent family history.  History  Substance Use Topics  . Smoking status: Current Some Day Smoker -- 1.0 packs/day    Types: Cigarettes  . Smokeless tobacco: Not on file  . Alcohol Use: No    OB History    Grav Para Term Preterm Abortions TAB SAB Ect Mult Living                  Review of Systems  Constitutional: Negative for fever and chills.  HENT: Negative for congestion, sore throat, rhinorrhea and neck pain.   Respiratory: Negative for shortness of breath.   Gastrointestinal: Negative for nausea and  vomiting.  Musculoskeletal: Negative for back pain.       Left elbow pain  Neurological: Negative for weakness and numbness.  All other systems reviewed and are negative.    Allergies  Review of patient's allergies indicates no known allergies.  Home Medications  No current outpatient prescriptions on file.  Triage Vitals: BP 104/63  Pulse 82  Temp 98.5 F (36.9 C) (Oral)  Resp 18  SpO2 99%  LMP 06/11/2012  Physical Exam  Nursing note and vitals reviewed. Constitutional: She is oriented to person, place, and time. She appears well-developed and well-nourished. No distress.  HENT:  Head: Normocephalic and atraumatic.  Eyes: EOM are normal. Pupils are equal, round, and reactive to light.  Neck: Neck supple. No tracheal deviation present.  Cardiovascular: Normal rate.   Pulmonary/Chest: Effort normal. No respiratory distress.  Abdominal: Soft. She exhibits no distension.  Musculoskeletal: Normal range of motion. She exhibits no edema.       Left shoulder: Normal.       Left wrist: Normal.       Normal ROM. Normal rotation. Tenderness to insertion of the left bicep brachii.  Neurological: She is alert and oriented to person, place, and time.  Skin: Skin is warm and dry.  Psychiatric: She has a normal mood and affect.    ED Course  Procedures (including critical care time) DIAGNOSTIC STUDIES: Oxygen Saturation is 99% on room air, normal by my interpretation.  COORDINATION OF CARE: 15:28--I evaluated the patient and we discussed a treatment plan including ibuprofen/advil/motrin, alternating ice pack and warm compress to which the pt agreed. I notified the pt that her symptoms are probably due to tendonitis of the bicep brachii.   Labs Reviewed - No data to display Dg Elbow Complete Left  06/19/2012  *RADIOLOGY REPORT*  Clinical Data: Left elbow pain radiating proximally  LEFT ELBOW - COMPLETE 3+ VIEW  Comparison: None  Findings: Bone mineralization normal. Joint spaces  preserved. No fracture, dislocation, or bone destruction. No joint effusion.  IMPRESSION: No acute osseous abnormalities.   Original Report Authenticated By: Ulyses Southward, M.D.      No diagnosis found.    MDM  L elbow pain, specifically at the insertion site of L bicep brachii.  No evidence of infection.  FROM.  RICE therapy discussed.  Decreased repetitive activity discussed.  Pt voice understanding and agrees with plan.  Ortho referral as needed.     I have reviewed nursing notes and vital signs. I personally reviewed the imaging tests through PACS system  I reviewed available ER/hospitalization records thought the EMR    I personally performed the services described in this documentation, which was scribed in my presence. The recorded information has been reviewed and is accurate.   Fayrene Helper, PA-C 06/19/12 1545

## 2012-12-23 ENCOUNTER — Encounter (HOSPITAL_COMMUNITY): Payer: Self-pay | Admitting: Emergency Medicine

## 2012-12-23 ENCOUNTER — Emergency Department (HOSPITAL_COMMUNITY)
Admission: EM | Admit: 2012-12-23 | Discharge: 2012-12-23 | Disposition: A | Discharge status: Home / Self Care | Payer: Medicaid Other | Source: Home / Self Care

## 2012-12-23 DIAGNOSIS — A6000 Herpesviral infection of urogenital system, unspecified: Secondary | ICD-10-CM

## 2012-12-23 MED ORDER — VALACYCLOVIR HCL 500 MG PO TABS: 500.0000 mg | ORAL_TABLET | Freq: Two times a day (BID) | ORAL | Status: AC

## 2012-12-23 NOTE — ED Notes (Signed)
Pt c/o "just" needing refill on her valtrex... Reports she doesn't want everybody to know her business... At first she denied a flare up but than she stated she's had a flare up since Thursday... using vulgar language when assessed.... Alert w/no signs of acute distress.

## 2012-12-23 NOTE — ED Provider Notes (Signed)
Teresa Burns is a 27 y.o. female who presents to Urgent Care today for herpes. Patient has a past medical history significant for frequent general herpes outbreaks. She previously was on suppressive therapy but ran out recently. She feels some tingling and feels as though she will soon have an outbreak. She denies any lesions currently discharge fevers or chills. She denies any abdominal pain and feels well otherwise. She refuses a genital exam.    PMH reviewed. General herpes History  Substance Use Topics  . Smoking status: Current Some Day Smoker -- 1.00 packs/day    Types: Cigarettes  . Smokeless tobacco: Not on file  . Alcohol Use: No   ROS as above Medications reviewed. No current facility-administered medications for this encounter.   Current Outpatient Prescriptions  Medication Sig Dispense Refill  . ibuprofen (ADVIL,MOTRIN) 600 MG tablet Take 1 tablet (600 mg total) by mouth every 6 (six) hours as needed for pain.  30 tablet  0  . ValACYclovir HCl (VALTREX PO) Take by mouth.        Exam:  BP 103/66  Pulse 69  Temp(Src) 98.3 F (36.8 C) (Other (Comment))  Resp 17  SpO2 100%  LMP 12/09/2012 Gen: Well NAD HEENT: EOMI,  MMM Lungs: CTABL Nl WOB Heart: RRR no MRG Abd: NABS, NT, ND Exts: Non edematous BL  LE, warm and well perfused.   No results found for this or any previous visit (from the past 24 hour(s)). No results found.  Assessment and Plan: 27 y.o. female with prodrome for genital herpes outbreak.  Plan to place patient on suppressive therapy.  Followup with primary care Dr. Return as needed Discussed warning signs or symptoms. Please see discharge instructions. Patient expresses understanding.      Rodolph Bong, MD 12/23/12 [phone]

## 2013-02-10 ENCOUNTER — Emergency Department (HOSPITAL_COMMUNITY)
Admission: EM | Admit: 2013-02-10 | Discharge: 2013-02-10 | Disposition: A | Discharge status: Home / Self Care | Payer: Medicaid Other | Source: Home / Self Care | Attending: Family Medicine | Admitting: Family Medicine

## 2013-02-10 ENCOUNTER — Encounter (HOSPITAL_COMMUNITY): Payer: Self-pay | Admitting: Emergency Medicine

## 2013-02-10 DIAGNOSIS — B009 Herpesviral infection, unspecified: Secondary | ICD-10-CM

## 2013-02-10 HISTORY — DX: Herpesviral infection, unspecified: B00.9

## 2013-02-10 MED ORDER — VALACYCLOVIR HCL 500 MG PO TABS: ORAL_TABLET | ORAL | Status: DC

## 2013-02-10 NOTE — ED Notes (Signed)
Patient requesting refill of valtrex.  Patient reports has history of herpes and usually treats with valtrex.

## 2013-02-10 NOTE — ED Provider Notes (Signed)
CSN: [id number]     Arrival date & time 02/10/13  1532 History   First MD Initiated Contact with Patient 02/10/13 1640     Chief Complaint  Patient presents with  . Medication Refill   (Consider location/radiation/quality/duration/timing/severity/associated sxs/prior Treatment) HPI Patient is a 27 yo F presenting for recurrent HSV outbreak. She was unable to get med refill from her PCP. She states she has outbreaks related to her menstrual cycle. This has been a long standing problem for her. Current outbreak started 2 days ago. She has not had any fevers or other signs/sx of illness.  Past Medical History  Diagnosis Date  . Sickle cell trait   . Herpes    History reviewed. No pertinent past surgical history. No family history on file. History  Substance Use Topics  . Smoking status: Current Some Day Smoker -- 1.00 packs/day    Types: Cigarettes  . Smokeless tobacco: Not on file  . Alcohol Use: Yes   OB History   Grav Para Term Preterm Abortions TAB SAB Ect Mult Living                 Review of Systems  Constitutional: Negative for fever and chills.  HENT: Negative for congestion.   Respiratory: Negative for chest tightness and shortness of breath.   Cardiovascular: Negative for chest pain.  Gastrointestinal: Negative for abdominal pain.  Genitourinary: Positive for genital sores and vaginal pain. Negative for dysuria, vaginal discharge and pelvic pain.  Musculoskeletal: Negative for myalgias.  Skin: Negative for rash.  Neurological: Negative for headaches.  All other systems reviewed and are negative.    Allergies  Review of patient's allergies indicates no known allergies.  Home Medications   Current Outpatient Rx  Name  Route  Sig  Dispense  Refill  . valACYclovir (VALTREX) 500 MG tablet      Take twice daily for 3 days, then take one tab daily for prevention   30 tablet   1    LMP 02/10/2013 Physical Exam  Constitutional: She appears well-developed and  well-nourished. No distress.  HENT:  Head: Normocephalic and atraumatic.  Mouth/Throat: Oropharynx is clear and moist.  Neck: Normal range of motion.  Cardiovascular: Normal rate, regular rhythm and normal heart sounds.   Pulmonary/Chest: Effort normal and breath sounds normal.  Abdominal: Soft. There is no tenderness.  Genitourinary:  Exam deferred; patient preference  Musculoskeletal: Normal range of motion. She exhibits no edema.  Lymphadenopathy:    She has no cervical adenopathy.  Neurological: She is alert.  Skin: Skin is warm and dry.    ED Course  Procedures (including critical care time) Labs Review Labs Reviewed - No data to display Imaging Review No results found.  MDM   1. HSV (herpes simplex virus) infection    27 yo F with recurrent HSV infection, unable to get Valtrex refilled - Valtrex 500mg  BID x3 days for treatment of current outbreak. She should then take 500mg  daily for suppressive treatment - Establish PCP. In process of changing medicaid card. Given information resources sheet. - Return to [facility] for any further concerns    Hilarie Fredrickson, MD 02/10/13 1720

## 2013-02-10 NOTE — ED Notes (Signed)
Patient is on telephone

## 2013-02-11 NOTE — ED Provider Notes (Signed)
Medical screening examination/treatment/procedure(s) were performed by a resident physician or non-physician practitioner and as the supervising physician I was immediately available for consultation/collaboration.  Arlesia Kiel, MD    Madelein Mahadeo S Wilna Pennie, MD 02/11/13 0850 

## 2013-05-30 ENCOUNTER — Other Ambulatory Visit: Payer: Self-pay | Admitting: Family Medicine

## 2013-06-02 ENCOUNTER — Emergency Department (HOSPITAL_COMMUNITY)
Admission: EM | Admit: 2013-06-02 | Discharge: 2013-06-02 | Disposition: A | Discharge status: Home / Self Care | Payer: Medicaid Other | Source: Home / Self Care | Attending: Family Medicine | Admitting: Family Medicine

## 2013-06-02 ENCOUNTER — Encounter (HOSPITAL_COMMUNITY): Payer: Self-pay | Admitting: Emergency Medicine

## 2013-06-02 DIAGNOSIS — A6000 Herpesviral infection of urogenital system, unspecified: Secondary | ICD-10-CM

## 2013-06-02 MED ORDER — VALACYCLOVIR HCL 1 G PO TABS: 1000.0000 mg | ORAL_TABLET | Freq: Every day | ORAL | Status: AC

## 2013-06-02 NOTE — Discharge Instructions (Signed)
Thank you for coming in today. Take 2 pills once daily to prevent herpes outbreaks. If you have an outbreak take one pill 3 times a day for 3 days and then go back to 2 pills once daily. Followup with your primary care provider You may ask to be referred to a OB/GYN Dr. for this issue  Genital Herpes Genital herpes is a sexually transmitted disease. This means that it is a disease passed by having sex with an infected person. There is no cure for genital herpes. The time between attacks can be months to years. The virus may live in a person but produce no problems (symptoms). This infection can be passed to a baby as it travels down the birth canal (vagina). In a newborn, this can cause central nervous system damage, eye damage, or even death. The virus that causes genital herpes is usually HSV-2 virus. The virus that causes oral herpes is usually HSV-1. The diagnosis (learning what is wrong) is made through culture results. SYMPTOMS  Usually symptoms of pain and itching begin a few days to a week after contact. It first appears as small blisters that progress to small painful ulcers which then scab over and heal after several days. It affects the outer genitalia, birth canal, cervix, penis, anal area, buttocks, and thighs. HOME CARE INSTRUCTIONS   Keep ulcerated areas dry and clean.  Take medications as directed. Antiviral medications can speed up healing. They will not prevent recurrences or cure this infection. These medications can also be taken for suppression if there are frequent recurrences.  While the infection is active, it is contagious. Avoid all sexual contact during active infections.  Condoms may help prevent spread of the herpes virus.  Practice safe sex.  Wash your hands thoroughly after touching the genital area.  Avoid touching your eyes after touching your genital area.  Inform your caregiver if you have had genital herpes and become pregnant. It is your responsibility  to insure a safe outcome for your baby in this pregnancy.  Only take over-the-counter or prescription medicines for pain, discomfort, or fever as directed by your caregiver. SEEK MEDICAL CARE IF:   You have a recurrence of this infection.  You do not respond to medications and are not improving.  You have new sources of pain or discharge which have changed from the original infection.  You have an oral temperature above 102 F (38.9 C).  You develop abdominal pain.  You develop eye pain or signs of eye infection. Document Released: 05/14/2000 Document Revised: 08/09/2011 Document Reviewed: 06/04/2009 [facility] Patient Information 2014 [place], [place].

## 2013-06-02 NOTE — ED Provider Notes (Signed)
Teresa Burns is a 28 y.o. female who presents to Urgent Care today for refill of Valtrex. Patient has frequent genital herpes outbreaks. She ran out about fax about one week ago. She is just beginning to have an outbreak now. She typically takes her medications prophylactically. She feels well otherwise no nausea vomiting diarrhea fevers or chills. Note vaginal discharge.   Past Medical History  Diagnosis Date  . Sickle cell trait   . Herpes    History  Substance Use Topics  . Smoking status: Current Some Day Smoker -- 1.00 packs/day    Types: Cigarettes  . Smokeless tobacco: Not on file  . Alcohol Use: Yes   ROS as above Medications reviewed. No current facility-administered medications for this encounter.   Current Outpatient Prescriptions  Medication Sig Dispense Refill  . valACYclovir (VALTREX) 1000 MG tablet Take 1 tablet (1,000 mg total) by mouth daily.  60 tablet  1    Exam:  BP 127/89  Pulse 63  Temp(Src) 98.9 F (37.2 C) (Oral)  Resp 18  SpO2 100%  LMP 05/11/2013 Gen: Well NAD Patient declines genital exam.  Assessment and Plan: 28 y.o. female with genital herpes outbreak. Patient has frequent to herpes outbreaks. Refill Valtrex at prophylactic dose. Will start taking treatment dose for 3 days today. Followup with primary care provider for the medications. Discussed warning signs or symptoms. Please see discharge instructions. Patient expresses understanding.      Rodolph BongEvan S Marcelle Bebout, MD 06/02/13 [phone]

## 2013-06-02 NOTE — ED Notes (Signed)
C/o medication refill on valtrex

## 2013-08-03 ENCOUNTER — Encounter (HOSPITAL_COMMUNITY): Payer: Self-pay | Admitting: Emergency Medicine

## 2013-08-03 ENCOUNTER — Emergency Department (HOSPITAL_COMMUNITY)
Admission: EM | Admit: 2013-08-03 | Discharge: 2013-08-03 | Disposition: A | Discharge status: Home / Self Care | Payer: Medicaid Other | Source: Home / Self Care

## 2013-08-03 DIAGNOSIS — J069 Acute upper respiratory infection, unspecified: Secondary | ICD-10-CM

## 2013-08-03 DIAGNOSIS — R059 Cough, unspecified: Secondary | ICD-10-CM

## 2013-08-03 DIAGNOSIS — Z202 Contact with and (suspected) exposure to infections with a predominantly sexual mode of transmission: Secondary | ICD-10-CM

## 2013-08-03 DIAGNOSIS — R05 Cough: Secondary | ICD-10-CM

## 2013-08-03 DIAGNOSIS — F17200 Nicotine dependence, unspecified, uncomplicated: Secondary | ICD-10-CM

## 2013-08-03 LAB — POCT URINALYSIS DIP (DEVICE)
Bilirubin Urine: NEGATIVE
GLUCOSE, UA: NEGATIVE mg/dL
Ketones, ur: NEGATIVE mg/dL
Leukocytes, UA: NEGATIVE
NITRITE: NEGATIVE
PROTEIN: NEGATIVE mg/dL
SPECIFIC GRAVITY, URINE: 1.02 (ref 1.005–1.030)
UROBILINOGEN UA: 0.2 mg/dL (ref 0.0–1.0)
pH: 6 (ref 5.0–8.0)

## 2013-08-03 LAB — POCT PREGNANCY, URINE: Preg Test, Ur: NEGATIVE

## 2013-08-03 MED ORDER — GUAIFENESIN-CODEINE 100-10 MG/5ML PO SYRP: 5.0000 mL | ORAL_SOLUTION | ORAL | Status: DC | PRN

## 2013-08-03 MED ORDER — CETIRIZINE HCL 10 MG PO CAPS: ORAL_CAPSULE | ORAL | Status: DC

## 2013-08-03 NOTE — Discharge Instructions (Signed)
Upper Respiratory Infection, Adult °An upper respiratory infection (URI) is also sometimes known as the common cold. The upper respiratory tract includes the nose, sinuses, throat, trachea, and bronchi. Bronchi are the airways leading to the lungs. Most people improve within 1 week, but symptoms can last up to 2 weeks. A residual cough may last even longer.  °CAUSES °Many different viruses can infect the tissues lining the upper respiratory tract. The tissues become irritated and inflamed and often become very moist. Mucus production is also common. A cold is contagious. You can easily spread the virus to others by oral contact. This includes kissing, sharing a glass, coughing, or sneezing. Touching your mouth or nose and then touching a surface, which is then touched by another person, can also spread the virus. °SYMPTOMS  °Symptoms typically develop 1 to 3 days after you come in contact with a cold virus. Symptoms vary from person to person. They may include: °· Runny nose. °· Sneezing. °· Nasal congestion. °· Sinus irritation. °· Sore throat. °· Loss of voice (laryngitis). °· Cough. °· Fatigue. °· Muscle aches. °· Loss of appetite. °· Headache. °· Low-grade fever. °DIAGNOSIS  °You might diagnose your own cold based on familiar symptoms, since most people get a cold 2 to 3 times a year. Your caregiver can confirm this based on your exam. Most importantly, your caregiver can check that your symptoms are not due to another disease such as strep throat, sinusitis, pneumonia, asthma, or epiglottitis. Blood tests, throat tests, and X-rays are not necessary to diagnose a common cold, but they may sometimes be helpful in excluding other more serious diseases. Your caregiver will decide if any further tests are required. °RISKS AND COMPLICATIONS  °You may be at risk for a more severe case of the common cold if you smoke cigarettes, have chronic heart disease (such as heart failure) or lung disease (such as asthma), or if  you have a weakened immune system. The very young and very old are also at risk for more serious infections. Bacterial sinusitis, middle ear infections, and bacterial pneumonia can complicate the common cold. The common cold can worsen asthma and chronic obstructive pulmonary disease (COPD). Sometimes, these complications can require emergency medical care and may be life-threatening. °PREVENTION  °The best way to protect against getting a cold is to practice good hygiene. Avoid oral or hand contact with people with cold symptoms. Wash your hands often if contact occurs. There is no clear evidence that vitamin C, vitamin E, echinacea, or exercise reduces the chance of developing a cold. However, it is always recommended to get plenty of rest and practice good nutrition. °TREATMENT  °Treatment is directed at relieving symptoms. There is no cure. Antibiotics are not effective, because the infection is caused by a virus, not by bacteria. Treatment may include: °· Increased fluid intake. Sports drinks offer valuable electrolytes, sugars, and fluids. °· Breathing heated mist or steam (vaporizer or shower). °· Eating chicken soup or other clear broths, and maintaining good nutrition. °· Getting plenty of rest. °· Using gargles or lozenges for comfort. °· Controlling fevers with ibuprofen or acetaminophen as directed by your caregiver. °· Increasing usage of your inhaler if you have asthma. °Zinc gel and zinc lozenges, taken in the first 24 hours of the common cold, can shorten the duration and lessen the severity of symptoms. Pain medicines may help with fever, muscle aches, and throat pain. A variety of non-prescription medicines are available to treat congestion and runny nose. Your caregiver   can make recommendations and may suggest nasal or lung inhalers for other symptoms.  HOME CARE INSTRUCTIONS   Only take over-the-counter or prescription medicines for pain, discomfort, or fever as directed by your  caregiver.  Use a warm mist humidifier or inhale steam from a shower to increase air moisture. This may keep secretions moist and make it easier to breathe.  Drink enough water and fluids to keep your urine clear or pale yellow.  Rest as needed.  Return to work when your temperature has returned to normal or as your caregiver advises. You may need to stay home longer to avoid infecting others. You can also use a face mask and careful hand washing to prevent spread of the virus. SEEK MEDICAL CARE IF:   After the first few days, you feel you are getting worse rather than better.  You need your caregiver's advice about medicines to control symptoms.  You develop chills, worsening shortness of breath, or brown or red sputum. These may be signs of pneumonia.  You develop yellow or brown nasal discharge or pain in the face, especially when you bend forward. These may be signs of sinusitis.  You develop a fever, swollen neck glands, pain with swallowing, or white areas in the back of your throat. These may be signs of strep throat. SEEK IMMEDIATE MEDICAL CARE IF:   You have a fever.  You develop severe or persistent headache, ear pain, sinus pain, or chest pain.  You develop wheezing, a prolonged cough, cough up blood, or have a change in your usual mucus (if you have chronic lung disease).  You develop sore muscles or a stiff neck. Document Released: 11/10/2000 Document Revised: 08/09/2011 Document Reviewed: 09/18/2010 [facility]Exit[facility] Patient Information 2014 [place], [place].  Smoking Cessation Quitting smoking is important to your health and has many advantages. However, it is not always easy to quit since nicotine is a very addictive drug. Often times, people try 3 times or more before being able to quit. This document explains the best ways for you to prepare to quit smoking. Quitting takes hard work and a lot of effort, but you can do it. ADVANTAGES OF QUITTING SMOKING  You will live  longer, feel better, and live better.  Your body will feel the impact of quitting smoking almost immediately.  Within 20 minutes, blood pressure decreases. Your pulse returns to its normal level.  After 8 hours, carbon monoxide levels in the blood return to normal. Your oxygen level increases.  After 24 hours, the chance of having a heart attack starts to decrease. Your breath, hair, and body stop smelling like smoke.  After 48 hours, damaged nerve endings begin to recover. Your sense of taste and smell improve.  After 72 hours, the body is virtually free of nicotine. Your bronchial tubes relax and breathing becomes easier.  After 2 to 12 weeks, lungs can hold more air. Exercise becomes easier and circulation improves.  The risk of having a heart attack, stroke, cancer, or lung disease is greatly reduced.  After 1 year, the risk of coronary heart disease is cut in half.  After 5 years, the risk of stroke falls to the same as a nonsmoker.  After 10 years, the risk of lung cancer is cut in half and the risk of other cancers decreases significantly.  After 15 years, the risk of coronary heart disease drops, usually to the level of a nonsmoker.  If you are pregnant, quitting smoking will improve your chances of having a  healthy baby.  The people you live with, especially any children, will be healthier.  You will have extra money to spend on things other than cigarettes. QUESTIONS TO THINK ABOUT BEFORE ATTEMPTING TO QUIT You may want to talk about your answers with your caregiver.  Why do you want to quit?  If you tried to quit in the past, what helped and what did not?  What will be the most difficult situations for you after you quit? How will you plan to handle them?  Who can help you through the tough times? Your family? Friends? A caregiver?  What pleasures do you get from smoking? What ways can you still get pleasure if you quit? Here are some questions to ask your  caregiver:  How can you help me to be successful at quitting?  What medicine do you think would be best for me and how should I take it?  What should I do if I need more help?  What is smoking withdrawal like? How can I get information on withdrawal? GET READY  Set a quit date.  Change your environment by getting rid of all cigarettes, ashtrays, matches, and lighters in your home, car, or work. Do not let people smoke in your home.  Review your past attempts to quit. Think about what worked and what did not. GET SUPPORT AND ENCOURAGEMENT You have a better chance of being successful if you have help. You can get support in many ways.  Tell your family, friends, and co-workers that you are going to quit and need their support. Ask them not to smoke around you.  Get individual, group, or telephone counseling and support. Programs are available at [employer] and health centers. Call your local health department for information about programs in your area.  Spiritual beliefs and practices may help some smokers quit.  Download a "quit meter" on your computer to keep track of quit statistics, such as how long you have gone without smoking, cigarettes not smoked, and money saved.  Get a self-help book about quitting smoking and staying off of tobacco. LEARN NEW SKILLS AND BEHAVIORS  Distract yourself from urges to smoke. Talk to someone, go for a walk, or occupy your time with a task.  Change your normal routine. Take a different route to work. Drink tea instead of coffee. Eat breakfast in a different place.  Reduce your stress. Take a hot bath, exercise, or read a book.  Plan something enjoyable to do every day. Reward yourself for not smoking.  Explore interactive web-based programs that specialize in helping you quit. GET MEDICINE AND USE IT CORRECTLY Medicines can help you stop smoking and decrease the urge to smoke. Combining medicine with the above behavioral methods and  support can greatly increase your chances of successfully quitting smoking.  Nicotine replacement therapy helps deliver nicotine to your body without the negative effects and risks of smoking. Nicotine replacement therapy includes nicotine gum, lozenges, inhalers, nasal sprays, and skin patches. Some may be available over-the-counter and others require a prescription.  Antidepressant medicine helps people abstain from smoking, but how this works is unknown. This medicine is available by prescription.  Nicotinic receptor partial agonist medicine simulates the effect of nicotine in your brain. This medicine is available by prescription. Ask your caregiver for advice about which medicines to use and how to use them based on your health history. Your caregiver will tell you what side effects to look out for if you choose to be on  a medicine or therapy. Carefully read the information on the package. Do not use any other product containing nicotine while using a nicotine replacement product.  RELAPSE OR DIFFICULT SITUATIONS Most relapses occur within the first 3 months after quitting. Do not be discouraged if you start smoking again. Remember, most people try several times before finally quitting. You may have symptoms of withdrawal because your body is used to nicotine. You may crave cigarettes, be irritable, feel very hungry, cough often, get headaches, or have difficulty concentrating. The withdrawal symptoms are only temporary. They are strongest when you first quit, but they will go away within 10 14 days. To reduce the chances of relapse, try to:  Avoid drinking alcohol. Drinking lowers your chances of successfully quitting.  Reduce the amount of caffeine you consume. Once you quit smoking, the amount of caffeine in your body increases and can give you symptoms, such as a rapid heartbeat, sweating, and anxiety.  Avoid smokers because they can make you want to smoke.  Do not let weight gain distract  you. Many smokers will gain weight when they quit, usually less than 10 pounds. Eat a healthy diet and stay active. You can always lose the weight gained after you quit.  Find ways to improve your mood other than smoking. FOR MORE INFORMATION  www.smokefree.gov  Document Released: 05/11/2001 Document Revised: 11/16/2011 Document Reviewed: 08/26/2011 [facility] Patient Information 2014 [place], [place].  Cough, Adult  A cough is a reflex that helps clear your throat and airways. It can help heal the body or may be a reaction to an irritated airway. A cough may only last 2 or 3 weeks (acute) or may last more than 8 weeks (chronic).  CAUSES Acute cough:  Viral or bacterial infections. Chronic cough:  Infections.  Allergies.  Asthma.  Post-nasal drip.  Smoking.  Heartburn or acid reflux.  Some medicines.  Chronic lung problems (COPD).  Cancer. SYMPTOMS   Cough.  Fever.  Chest pain.  Increased breathing rate.  High-pitched whistling sound when breathing (wheezing).  Colored mucus that you cough up (sputum). TREATMENT   A bacterial cough may be treated with antibiotic medicine.  A viral cough must run its course and will not respond to antibiotics.  Your caregiver may recommend other treatments if you have a chronic cough. HOME CARE INSTRUCTIONS   Only take over-the-counter or prescription medicines for pain, discomfort, or fever as directed by your caregiver. Use cough suppressants only as directed by your caregiver.  Use a cold steam vaporizer or humidifier in your bedroom or home to help loosen secretions.  Sleep in a semi-upright position if your cough is worse at night.  Rest as needed.  Stop smoking if you smoke. SEEK IMMEDIATE MEDICAL CARE IF:   You have pus in your sputum.  Your cough starts to worsen.  You cannot control your cough with suppressants and are losing sleep.  You begin coughing up blood.  You have difficulty breathing.  You  develop pain which is getting worse or is uncontrolled with medicine.  You have a fever. MAKE SURE YOU:   Understand these instructions.  Will watch your condition.  Will get help right away if you are not doing well or get worse. Document Released: 11/13/2010 Document Revised: 08/09/2011 Document Reviewed: 11/13/2010 [facility] Patient Information 2014 [place], [place]. When menses flow has stopped have testing for STD Call your doctor for appontment

## 2013-08-03 NOTE — ED Provider Notes (Signed)
Medical screening examination/treatment/procedure(s) were performed by resident physician or non-physician practitioner and as supervising physician I was immediately available for consultation/collaboration.   Barkley BrunsKINDL,Jiraiya Mcewan DOUGLAS MD.   Linna HoffJames D Berlie Persky, MD 08/03/13 [phone]

## 2013-08-03 NOTE — ED Notes (Signed)
Reports boyfriend recently dx with NGU.  Having severe pelvic/abdominal cramping.  States "started cycle today but cramping is much more severe not normal for me to be in pain like this".    Also c/o  Dry nonproductive cough,  No relief with otc meds.

## 2013-08-03 NOTE — ED Provider Notes (Signed)
CSN: [id number]     Arrival date & time 08/03/13  [id number] History   First MD Initiated Contact with Patient 08/03/13 [phone]     Chief Complaint  Patient presents with  . Abdominal Cramping    severe menstrual cramping  . Exposure to STD  . Cough   (Consider location/radiation/quality/duration/timing/severity/associated sxs/prior Treatment) HPI Comments: 28 year old female presents with 2 complaints. Course complaint is that of upper respiratory congestion associated with cough that began approximately one week ago. She took a Tyleno brand OTC medication that helped some but it did not give it up or coughing. She continues to smoke. She describes her cough is dry and feels a tingling sensation down her throat.  Her second complaint is that of concern for possible exposure to STD. She brings in a paper from the health department stating that her sexual partner as in GU in that she needs to be checked. She is currently having a menstrual flow associated with pelvic cramping.  Time I walk into the ring she stated that she did not want to wait any longer and had a pelvic exam. She just wants her cough treated.   Past Medical History  Diagnosis Date  . Sickle cell trait   . Herpes    History reviewed. No pertinent past surgical history. History reviewed. No pertinent family history. History  Substance Use Topics  . Smoking status: Current Some Day Smoker -- 1.00 packs/day    Types: Cigarettes  . Smokeless tobacco: Not on file  . Alcohol Use: Yes   OB History   Grav Para Term Preterm Abortions TAB SAB Ect Mult Living                 Review of Systems  Constitutional: Negative.  Negative for fever, chills, activity change, appetite change and fatigue.  HENT: Positive for congestion, postnasal drip and rhinorrhea. Negative for facial swelling and sore throat.   Eyes: Negative.   Respiratory: Positive for cough. Negative for shortness of breath.   Cardiovascular: Negative.    Genitourinary: Negative.   Musculoskeletal: Negative for neck pain and neck stiffness.  Skin: Negative for pallor and rash.  Neurological: Negative.     Allergies  Review of patient's allergies indicates no known allergies.  Home Medications   Current Outpatient Rx  Name  Route  Sig  Dispense  Refill  . Cetirizine HCl 10 MG CAPS      1 cap daily for drainage   24 capsule   0   . guaiFENesin-codeine (CHERATUSSIN AC) 100-10 MG/5ML syrup   Oral   Take 5 mLs by mouth every 4 (four) hours as needed for cough or congestion.   120 mL   0    BP 112/68  Pulse 80  Temp(Src) 98.4 F (36.9 C) (Oral)  Resp 14  SpO2 100%  LMP 08/03/2013 Physical Exam  Nursing note and vitals reviewed. Constitutional: She is oriented to person, place, and time. She appears well-developed and well-nourished. No distress.  HENT:  Bilateral TMs are normal Oropharynx is erythematous without exudates. Positive for clear PND.  Eyes: Conjunctivae and EOM are normal.  Neck: Normal range of motion. Neck supple.  Cardiovascular: Normal rate and normal heart sounds.   Pulmonary/Chest: Effort normal and breath sounds normal. No respiratory distress. She has no wheezes. She has no rales.  Musculoskeletal: She exhibits no edema.  Lymphadenopathy:    She has no cervical adenopathy.  Neurological: She is alert and oriented to person, place, and time.  Skin: Skin is warm and dry.    ED Course  Procedures (including critical care time) Labs Review Labs Reviewed  POCT URINALYSIS DIP (DEVICE) - Abnormal; Notable for the following:    Hgb urine dipstick LARGE (*)    All other components within normal limits  POCT PREGNANCY, URINE   Imaging Review No results found.   MDM   1. URI (upper respiratory infection)   2. Cough   3. Smoking   4. Possible exposure to STD    As requested a pelvic exam was not performed. Since she is having a menstrual flow this would not be the best time to obtain samples.  She is instructed to followup with her PCP or if necessary may return to obtain swabs for STD testing. She is also concerned about dysmenorrhea she should also see her PCP.  Stop smoking. This is contributing to your URI and coughing problems. Instructions on STD exposure. When menstrual flow stops half testing performed medicine instructed Cheratussin AC for cough. Zyrtec 10 milligrams daily  Hayden Rasmussen, NP 08/03/13 1020

## 2013-08-08 ENCOUNTER — Emergency Department (HOSPITAL_COMMUNITY)
Admission: EM | Admit: 2013-08-08 | Discharge: 2013-08-08 | Disposition: A | Discharge status: Home / Self Care | Payer: Medicaid Other | Source: Home / Self Care | Attending: Family Medicine | Admitting: Family Medicine

## 2013-08-08 ENCOUNTER — Other Ambulatory Visit (HOSPITAL_COMMUNITY)
Admission: RE | Admit: 2013-08-08 | Discharge: 2013-08-08 | Disposition: A | Discharge status: Home / Self Care | Payer: Medicaid Other | Source: Ambulatory Visit | Attending: Emergency Medicine | Admitting: Emergency Medicine

## 2013-08-08 ENCOUNTER — Encounter (HOSPITAL_COMMUNITY): Payer: Self-pay | Admitting: Emergency Medicine

## 2013-08-08 DIAGNOSIS — Z202 Contact with and (suspected) exposure to infections with a predominantly sexual mode of transmission: Secondary | ICD-10-CM

## 2013-08-08 DIAGNOSIS — N760 Acute vaginitis: Secondary | ICD-10-CM | POA: Insufficient documentation

## 2013-08-08 DIAGNOSIS — Z113 Encounter for screening for infections with a predominantly sexual mode of transmission: Secondary | ICD-10-CM | POA: Insufficient documentation

## 2013-08-08 MED ORDER — CEFTRIAXONE SODIUM 250 MG IJ SOLR
INTRAMUSCULAR | Status: AC
  Filled 2013-08-08: qty 250

## 2013-08-08 MED ORDER — LIDOCAINE HCL (PF) 1 % IJ SOLN
INTRAMUSCULAR | Status: AC
  Filled 2013-08-08: qty 5

## 2013-08-08 MED ORDER — CEFTRIAXONE SODIUM 250 MG IJ SOLR
250.0000 mg | Freq: Once | INTRAMUSCULAR | Status: AC
  Administered 2013-08-08: 250 mg via INTRAMUSCULAR

## 2013-08-08 MED ORDER — AZITHROMYCIN 250 MG PO TABS
ORAL_TABLET | ORAL | Status: AC
  Filled 2013-08-08: qty 4

## 2013-08-08 MED ORDER — AZITHROMYCIN 250 MG PO TABS
1000.0000 mg | ORAL_TABLET | Freq: Every day | ORAL | Status: DC
  Administered 2013-08-08: 1000 mg via ORAL

## 2013-08-08 NOTE — ED Notes (Signed)
Here for std treatment.  Sexual partner recently dx with NGU.

## 2013-08-08 NOTE — Discharge Instructions (Signed)
Sexually Transmitted Disease A sexually transmitted disease (STD) is a disease or infection that may be passed (transmitted) from person to person, usually during sexual activity. This may happen by way of saliva, semen, blood, vaginal mucus, or urine. Common STDs include:   Gonorrhea.   Chlamydia.   Syphilis.   HIV and AIDS.   Genital herpes.   Hepatitis B and C.   Trichomonas.   Human papillomavirus (HPV).   Pubic lice.   Scabies.  Mites.  Bacterial vaginosis. WHAT ARE CAUSES OF STDs? An STD may be caused by bacteria, a virus, or parasites. STDs are often transmitted during sexual activity if one person is infected. However, they may also be transmitted through nonsexual means. STDs may be transmitted after:   Sexual intercourse with an infected person.   Sharing sex toys with an infected person.   Sharing needles with an infected person or using unclean piercing or tattoo needles.  Having intimate contact with the genitals, mouth, or rectal areas of an infected person.   Exposure to infected fluids during birth. WHAT ARE THE SIGNS AND SYMPTOMS OF STDs? Different STDs have different symptoms. Some people may not have any symptoms. If symptoms are present, they may include:   Painful or bloody urination.   Pain in the pelvis, abdomen, vagina, anus, throat, or eyes.   Skin rash, itching, irritation, growths, sores (lesions), ulcerations, or warts in the genital or anal area.  Abnormal vaginal discharge with or without bad odor.   Penile discharge in men.   Fever.   Pain or bleeding during sexual intercourse.   Swollen glands in the groin area.   Yellow skin and eyes (jaundice). This is seen with hepatitis.   Swollen testicles.  Infertility.  Sores and blisters in the mouth. HOW ARE STDs DIAGNOSED? To make a diagnosis, your health care provider may:   Take a medical history.   Perform a physical exam.   Take a sample of any  discharge for examination.  Swab the throat, cervix, opening to the penis, rectum, or vagina for testing.  Test a sample of your first morning urine.   Perform blood tests.   Perform a Pap smear, if this applies.   Perform a colposcopy.   Perform a laparoscopy.  HOW ARE STDs TREATED? Treatment depends on the STD. Some STDs may be treated but not cured.   Chlamydia, gonorrhea, trichomonas, and syphilis can be cured with antibiotics.   Genital herpes, hepatitis, and HIV can be treated, but not cured, with prescribed medicines. The medicines lessen symptoms.   Genital warts from HPV can be treated with medicine or by freezing, burning (electrocautery), or surgery. Warts may come back.   HPV cannot be cured with medicine or surgery. However, abnormal areas may be removed from the cervix, vagina, or vulva.   If your diagnosis is confirmed, your recent sexual partners need treatment. This is true even if they are symptom-free or have a negative culture or evaluation. They should not have sex until their health care providers say it is OK. HOW CAN I REDUCE MY RISK OF GETTING AN STD?  Use latex condoms, dental dams, and water-soluble lubricants during sexual activity. Do not use petroleum jelly or oils.  Get vaccinated for HPV and hepatitis. If you have not received these vaccines in the past, talk to your health care provider about whether one or both might be right for you.   Avoid risky sex practices that can break the skin.  WHAT SHOULD   I DO IF I THINK I HAVE AN STD?  See your health care provider.   Inform all sexual partners. They should be tested and treated for any STDs.  Do not have sex until your health care provider says it is OK. WHEN SHOULD I GET HELP? Seek immediate medical care if:  You develop severe abdominal pain.  You are a man and notice swelling or pain in the testicles.  You are a woman and notice swelling or pain in your vagina. Document  Released: 08/07/2002 Document Revised: 03/07/2013 Document Reviewed: 12/05/2012 [employer] Patient Information 2014 [employer].  
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
## 2013-08-08 NOTE — ED Provider Notes (Signed)
Medical screening examination/treatment/procedure(s) were performed by a resident physician or non-physician practitioner and as the supervising physician I was immediately available for consultation/collaboration.  Lyriq Finerty, MD    Jamie Hafford S Howell Groesbeck, MD 08/08/13 2121 

## 2013-08-08 NOTE — ED Notes (Signed)
Patient refused blood work, Leta JunglingMarchella Willard CMA went in to talk with her

## 2013-08-09 LAB — URINE CYTOLOGY ANCILLARY ONLY
CHLAMYDIA, DNA PROBE: NEGATIVE
Neisseria Gonorrhea: NEGATIVE
Trichomonas: POSITIVE — AB

## 2013-08-10 MED ORDER — METRONIDAZOLE 500 MG PO TABS: 1000.0000 mg | ORAL_TABLET | Freq: Two times a day (BID) | ORAL | Status: DC

## 2013-08-12 ENCOUNTER — Telehealth (HOSPITAL_COMMUNITY): Payer: Self-pay | Admitting: *Deleted

## 2013-08-12 NOTE — ED Notes (Signed)
GC/Chlamydia neg., Trich pos.  3/12 Message sent to Almedia BallsZach Baker PA.  He e-prescribed Flagyl to pt.'s pharmacy. I called pt. Pt. verified x 2 and given results.  Pt. Told she needs Flagyl for Trich.   Pt. instructed to no alcohol while taking this medication and where to pick up Rx. Vassie MoselleYork, Teresa Burns 08/12/2013

## 2013-12-23 ENCOUNTER — Encounter (HOSPITAL_COMMUNITY): Payer: Self-pay | Admitting: Emergency Medicine

## 2013-12-23 ENCOUNTER — Emergency Department (HOSPITAL_COMMUNITY)
Admission: EM | Admit: 2013-12-23 | Discharge: 2013-12-23 | Disposition: A | Discharge status: Home / Self Care | Payer: Medicaid Other | Source: Home / Self Care

## 2013-12-23 DIAGNOSIS — IMO0002 Reserved for concepts with insufficient information to code with codable children: Secondary | ICD-10-CM

## 2013-12-23 DIAGNOSIS — T148XXA Other injury of unspecified body region, initial encounter: Secondary | ICD-10-CM

## 2013-12-23 DIAGNOSIS — W503XXA Accidental bite by another person, initial encounter: Secondary | ICD-10-CM

## 2013-12-23 MED ORDER — TETANUS-DIPHTH-ACELL PERTUSSIS 5-2.5-18.5 LF-MCG/0.5 IM SUSP
0.5000 mL | Freq: Once | INTRAMUSCULAR | Status: AC
  Administered 2013-12-23: 0.5 mL via INTRAMUSCULAR

## 2013-12-23 MED ORDER — KETOROLAC TROMETHAMINE 30 MG/ML IJ SOLN
30.0000 mg | Freq: Once | INTRAMUSCULAR | Status: AC
  Administered 2013-12-23: 30 mg via INTRAMUSCULAR

## 2013-12-23 MED ORDER — AMOXICILLIN-POT CLAVULANATE 875-125 MG PO TABS: 1.0000 | ORAL_TABLET | Freq: Two times a day (BID) | ORAL | Status: DC

## 2013-12-23 MED ORDER — KETOROLAC TROMETHAMINE 30 MG/ML IJ SOLN
INTRAMUSCULAR | Status: AC
  Filled 2013-12-23: qty 1

## 2013-12-23 MED ORDER — TETANUS-DIPHTH-ACELL PERTUSSIS 5-2.5-18.5 LF-MCG/0.5 IM SUSP
INTRAMUSCULAR | Status: AC
  Filled 2013-12-23: qty 0.5

## 2013-12-23 NOTE — Discharge Instructions (Signed)
Sterile Tape Wound Care Some cuts and wounds can be closed using sterile tape, also called skin adhesive strips. Skin adhesive strips can be used for shallow (superficial) and simple cuts, wounds, lacerations, and surgical incisions. These strips act in place of stitches to hold the edges of the wound together, allowing for faster healing. Unlike stitches, the adhesive strips do not require needles or anesthetic medicine for placement. The strips will wear off naturally as the wound is healing. It is important to take proper care of your wound at home while it heals.  HOME CARE INSTRUCTIONS  Try to keep the area around your wound clean and dry. Do not allow the adhesive strips to get wet for the first 12 hours.   Do not use any soaps or ointments on the wound for the first 12 hours.   If a bandage (dressing) has been applied, follow your health care provider's instructions for how often to change the dressing. Keep the dressing dry if one has been applied.   Do not remove the adhesive strips. They will fall off on their own. If they do not, you may remove them gently after 10 days. You should gently wet the strips before removing them. For example, this can be done in the shower.  Do not scratch, pick, or rub the wound area.   Protect the wound from further injury until it is healed.   Protect the wound from sun and tanning bed exposure while it is healing and for several weeks after healing.   Only take over-the-counter or prescription medicines as directed by your health care provider.   Keep all follow-up appointments as directed by your health care provider.  SEEK MEDICAL CARE IF: Your adhesive strips become wet or soaked with blood before the wound has healed. The tape will need to be replaced.  SEEK IMMEDIATE MEDICAL CARE IF:  You have increasing pain in the wound.   You develop a rash after the strips are applied.  Your wound becomes red, swollen, hot, or tender.   You  have a red streak that goes away from the wound.   You have pus coming from the wound.   You have increased bleeding from the wound.  You notice a bad smell coming from the wound.   Your wound breaks open. MAKE SURE YOU:  Understand these instructions.  Will watch your condition.  Will get help right away if you are not doing well or get worse. Document Released: 06/24/2004 Document Revised: 03/07/2013 Document Reviewed: 12/06/2012 [facility] Patient Information 2015 [place], [place]. This information is not intended to replace advice given to you by your health care provider. Make sure you discuss any questions you have with your health care provider. Laceration Care, Adult A laceration is a cut or lesion that goes through all layers of the skin and into the tissue just beneath the skin. TREATMENT  Some lacerations may not require closure. Some lacerations may not be able to be closed due to an increased risk of infection. It is important to see your caregiver as soon as possible after an injury to minimize the risk of infection and maximize the opportunity for successful closure. If closure is appropriate, pain medicines may be given, if needed. The wound will be cleaned to help prevent infection. Your caregiver will use stitches (sutures), staples, wound glue (adhesive), or skin adhesive strips to repair the laceration. These tools bring the skin edges together to allow for faster healing and a better cosmetic outcome.  However, all wounds will heal with a scar. Once the wound has healed, scarring can be minimized by covering the wound with sunscreen during the day for 1 full year. HOME CARE INSTRUCTIONS  For sutures or staples:  Keep the wound clean and dry.  If you were given a bandage (dressing), you should change it at least once a day. Also, change the dressing if it becomes wet or dirty, or as directed by your caregiver.  Wash the wound with soap and water 2 times a day. Rinse  the wound off with water to remove all soap. Pat the wound dry with a clean towel.  After cleaning, apply a thin layer of the antibiotic ointment as recommended by your caregiver. This will help prevent infection and keep the dressing from sticking.  You may shower as usual after the first 24 hours. Do not soak the wound in water until the sutures are removed.  Only take over-the-counter or prescription medicines for pain, discomfort, or fever as directed by your caregiver.  Get your sutures or staples removed as directed by your caregiver. For skin adhesive strips:  Keep the wound clean and dry.  Do not get the skin adhesive strips wet. You may bathe carefully, using caution to keep the wound dry.  If the wound gets wet, pat it dry with a clean towel.  Skin adhesive strips will fall off on their own. You may trim the strips as the wound heals. Do not remove skin adhesive strips that are still stuck to the wound. They will fall off in time. For wound adhesive:  You may briefly wet your wound in the shower or bath. Do not soak or scrub the wound. Do not swim. Avoid periods of heavy perspiration until the skin adhesive has fallen off on its own. After showering or bathing, gently pat the wound dry with a clean towel.  Do not apply liquid medicine, cream medicine, or ointment medicine to your wound while the skin adhesive is in place. This may loosen the film before your wound is healed.  If a dressing is placed over the wound, be careful not to apply tape directly over the skin adhesive. This may cause the adhesive to be pulled off before the wound is healed.  Avoid prolonged exposure to sunlight or tanning lamps while the skin adhesive is in place. Exposure to ultraviolet light in the first year will darken the scar.  The skin adhesive will usually remain in place for 5 to 10 days, then naturally fall off the skin. Do not pick at the adhesive film. You may need a tetanus shot if:  You  cannot remember when you had your last tetanus shot.  You have never had a tetanus shot. If you get a tetanus shot, your arm may swell, get red, and feel warm to the touch. This is common and not a problem. If you need a tetanus shot and you choose not to have one, there is a rare chance of getting tetanus. Sickness from tetanus can be serious. SEEK MEDICAL CARE IF:   You have redness, swelling, or increasing pain in the wound.  You see a red line that goes away from the wound.  You have yellowish-white fluid (pus) coming from the wound.  You have a fever.  You notice a bad smell coming from the wound or dressing.  Your wound breaks open before or after sutures have been removed.  You notice something coming out of the wound such as  wood or glass.  Your wound is on your hand or foot and you cannot move a finger or toe. SEEK IMMEDIATE MEDICAL CARE IF:   Your pain is not controlled with prescribed medicine.  You have severe swelling around the wound causing pain and numbness or a change in color in your arm, hand, leg, or foot.  Your wound splits open and starts bleeding.  You have worsening numbness, weakness, or loss of function of any joint around or beyond the wound.  You develop painful lumps near the wound or on the skin anywhere on your body. MAKE SURE YOU:   Understand these instructions.  Will watch your condition.  Will get help right away if you are not doing well or get worse. Document Released: 05/17/2005 Document Revised: 08/09/2011 Document Reviewed: 11/10/2010 [facility] Patient Information 2015 [place], [place]. This information is not intended to replace advice given to you by your health care provider. Make sure you discuss any questions you have with your health care provider. Human Bite Human bite wounds tend to become infected, even when they seem minor at first. Bite wounds of the hand can be serious because the tendons and joints are close to the skin.  Infection can develop very rapidly, even in a matter of hours.  DIAGNOSIS  Your caregiver will most likely:  Take a detailed history of the bite injury.  Perform a wound exam.  Take your medical history. Blood tests or X-rays may be performed. Sometimes, infected bite wounds are cultured and sent to a lab to identify the infectious bacteria. TREATMENT  Medical treatment will depend on the location of the bite as well as the patient's medical history. Treatment may include:  Wound care, such as cleaning and flushing the wound with saline solution, bandaging, and elevating the affected area.  Antibiotic medicine.  Tetanus immunization.  Leaving the wound open to heal. This is often done with human bites due to the high risk of infection. However, in certain cases, wound closure with stitches, wound adhesive, skin adhesive strips, or staples may be used. Infected bites that are left untreated may require intravenous (IV) antibiotics and surgical treatment in the hospital. HOME CARE INSTRUCTIONS  Follow your caregiver's instructions for wound care.  Take all medicines as directed.  If your caregiver prescribes antibiotics, take them as directed. Finish them even if you start to feel better.  Follow up with your caregiver for further exams or immunizations as directed. You may need a tetanus shot if:  You cannot remember when you had your last tetanus shot.  You have never had a tetanus shot.  The injury broke your skin. If you get a tetanus shot, your arm may swell, get red, and feel warm to the touch. This is common and not a problem. If you need a tetanus shot and you choose not to have one, there is a rare chance of getting tetanus. Sickness from tetanus can be serious. SEEK IMMEDIATE MEDICAL CARE IF:  You have increased pain, swelling, or redness around the bite wound.  You have chills.  You have a fever.  You have pus draining from the wound.  You have red streaks on  the skin coming from the wound.  You have pain with movement or trouble moving the injured part.  You are not improving, or you are getting worse.  You have any other questions or concerns. MAKE SURE YOU:  Understand these instructions.  Will watch your condition.  Will get help right away  if you are not doing well or get worse. Document Released: 06/24/2004 Document Revised: 08/09/2011 Document Reviewed: 01/06/2011 [facility] Patient Information 2015 [place], [place]. This information is not intended to replace advice given to you by your health care provider. Make sure you discuss any questions you have with your health care provider.

## 2013-12-23 NOTE — ED Notes (Signed)
Discussed  rx and treatment compliance, discussed wound care
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
## 2013-12-27 NOTE — ED Provider Notes (Signed)
Medical screening examination/treatment/procedure(s) were performed by a resident physician or non-physician practitioner and as the supervising physician I was immediately available for consultation/collaboration.  Clementeen GrahamEvan Corey, MD    Rodolph BongEvan S Corey, MD 12/27/13 [phone]

## 2014-02-10 ENCOUNTER — Emergency Department (HOSPITAL_COMMUNITY): Payer: Medicaid Other

## 2014-02-10 ENCOUNTER — Encounter (HOSPITAL_COMMUNITY): Payer: Self-pay | Admitting: Emergency Medicine

## 2014-02-10 ENCOUNTER — Emergency Department (HOSPITAL_COMMUNITY)
Admission: EM | Admit: 2014-02-10 | Discharge: 2014-02-10 | Disposition: A | Discharge status: Home / Self Care | Payer: Medicaid Other | Attending: Emergency Medicine | Admitting: Emergency Medicine

## 2014-02-10 DIAGNOSIS — Y9349 Activity, other involving dancing and other rhythmic movements: Secondary | ICD-10-CM | POA: Diagnosis not present

## 2014-02-10 DIAGNOSIS — Z8619 Personal history of other infectious and parasitic diseases: Secondary | ICD-10-CM | POA: Insufficient documentation

## 2014-02-10 DIAGNOSIS — Y929 Unspecified place or not applicable: Secondary | ICD-10-CM | POA: Insufficient documentation

## 2014-02-10 DIAGNOSIS — F17200 Nicotine dependence, unspecified, uncomplicated: Secondary | ICD-10-CM | POA: Diagnosis not present

## 2014-02-10 DIAGNOSIS — X58XXXA Exposure to other specified factors, initial encounter: Secondary | ICD-10-CM | POA: Diagnosis not present

## 2014-02-10 DIAGNOSIS — S20211A Contusion of right front wall of thorax, initial encounter: Secondary | ICD-10-CM

## 2014-02-10 DIAGNOSIS — Z862 Personal history of diseases of the blood and blood-forming organs and certain disorders involving the immune mechanism: Secondary | ICD-10-CM | POA: Diagnosis not present

## 2014-02-10 DIAGNOSIS — Z3202 Encounter for pregnancy test, result negative: Secondary | ICD-10-CM | POA: Diagnosis not present

## 2014-02-10 DIAGNOSIS — S20219A Contusion of unspecified front wall of thorax, initial encounter: Secondary | ICD-10-CM | POA: Insufficient documentation

## 2014-02-10 DIAGNOSIS — S3981XA Other specified injuries of abdomen, initial encounter: Secondary | ICD-10-CM | POA: Insufficient documentation

## 2014-02-10 LAB — URINALYSIS, ROUTINE W REFLEX MICROSCOPIC
Bilirubin Urine: NEGATIVE
Glucose, UA: NEGATIVE mg/dL
Ketones, ur: NEGATIVE mg/dL
Leukocytes, UA: NEGATIVE
Nitrite: NEGATIVE
PH: 5.5 (ref 5.0–8.0)
Protein, ur: NEGATIVE mg/dL
SPECIFIC GRAVITY, URINE: 1.016 (ref 1.005–1.030)
UROBILINOGEN UA: 0.2 mg/dL (ref 0.0–1.0)

## 2014-02-10 LAB — URINE MICROSCOPIC-ADD ON

## 2014-02-10 LAB — POC URINE PREG, ED: PREG TEST UR: NEGATIVE

## 2014-02-10 MED ORDER — TRAMADOL HCL 50 MG PO TABS: 50.0000 mg | ORAL_TABLET | Freq: Four times a day (QID) | ORAL | Status: DC | PRN

## 2014-02-10 MED ORDER — VALACYCLOVIR HCL 500 MG PO TABS: 500.0000 mg | ORAL_TABLET | Freq: Two times a day (BID) | ORAL | Status: DC

## 2014-02-10 MED ORDER — IBUPROFEN 600 MG PO TABS: 600.0000 mg | ORAL_TABLET | Freq: Four times a day (QID) | ORAL | Status: DC | PRN

## 2014-02-10 MED ORDER — HYDROCODONE-ACETAMINOPHEN 5-325 MG PO TABS
1.0000 | ORAL_TABLET | Freq: Once | ORAL | Status: AC
  Administered 2014-02-10: 1 via ORAL
  Filled 2014-02-10: qty 1
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
## 2014-02-10 NOTE — ED Notes (Signed)
Pt has small round nodule on right lower ribs. Pt states it is very painful- pain moves to right flank and back.

## 2014-02-10 NOTE — ED Provider Notes (Signed)
CSN: [id number]     Arrival date & time 02/10/14  5284 History   First MD Initiated Contact with Patient 02/10/14 [phone]     Chief Complaint  Patient presents with  . Abdominal Pain    A place on lower rib that hurts and sticks out.     (Consider location/radiation/quality/duration/timing/severity/associated sxs/prior Treatment) HPI Teresa Burns is a 28 y.o. female who presents to emergency department complaining of right ribs and right upper abdomen pain. 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 Patient states she just learned a new pole trick where she wraps her torso on a pole and  Spins around.  she's unsure if her pain is caused by this but states she developed pain several days ago and it has been worsening since. Pain is in the right , it radiates to the right flank, and right upper abdomen. Pain is worsened with movement. States today she was unable to do any pole tricks because of the pain. Patient states she also noticed some "bumps" over her lower rib cage that is tender to palpation. He denies any urinary symptoms. No nausea or vomiting. No changes in bowels. Not taking anything for pain.   Past Medical History  Diagnosis Date  . Sickle cell trait   . Herpes    History reviewed. No pertinent past surgical history. No family history on file. History  Substance Use Topics  . Smoking status: Current Some Day Smoker -- 1.00 packs/day    Types: Cigarettes  . Smokeless tobacco: Not on file  . Alcohol Use: Yes   OB History   Grav Para Term Preterm Abortions TAB SAB Ect Mult Living                 Review of Systems  Constitutional: Negative for fever and chills.  Respiratory: Positive for chest tightness. Negative for cough and shortness of breath.   Cardiovascular: Positive for chest pain. Negative for palpitations and leg swelling.  Gastrointestinal: Positive for abdominal pain. Negative for nausea, vomiting and diarrhea.  Genitourinary:  Negative for dysuria and flank pain.  Musculoskeletal: Negative for arthralgias, myalgias, neck pain and neck stiffness.  Skin: Negative for rash.  Neurological: Negative for dizziness, weakness and headaches.  All other systems reviewed and are negative.     Allergies  Review of patient's allergies indicates no known allergies.  Home Medications   Prior to Admission medications   Medication Sig Start Date End Date Taking? Authorizing Anusha Claus  ibuprofen (ADVIL,MOTRIN) 200 MG tablet Take 400 mg by mouth every 6 (six) hours as needed for mild pain.   Yes Historical Theoren Palka, MD   BP 109/60  Pulse 61  Temp(Src) 98 F (36.7 C) (Oral)  Resp 17  SpO2 99%  LMP 02/03/2014 Physical Exam  Nursing note and vitals reviewed. Constitutional: She is oriented to person, place, and time. She appears well-developed and well-nourished. No distress.  HENT:  Head: Normocephalic.  Eyes: Conjunctivae are normal.  Neck: Neck supple.  Cardiovascular: Normal rate, regular rhythm and normal heart sounds.   Pulmonary/Chest: Effort normal and breath sounds normal. No respiratory distress. She has no wheezes. She has no rales. She exhibits tenderness.  tenderness over right lower ribs, no bruising, swelling, deformity.   Abdominal: Soft. Bowel sounds are normal. She exhibits no distension. There is no tenderness. There is no rebound.  Right cva tenderness  Musculoskeletal: She exhibits no edema.  Neurological: She is alert and oriented to person,  place, and time.  Skin: Skin is warm and dry.  Psychiatric: She has a normal mood and affect. Her behavior is normal.    ED Course  Procedures (including critical care time) Labs Review Labs Reviewed  URINALYSIS, ROUTINE W REFLEX MICROSCOPIC - Abnormal; Notable for the following:    Hgb urine dipstick SMALL (*)    All other components within normal limits  URINE MICROSCOPIC-ADD ON  POC URINE PREG, ED    Imaging Review Dg Ribs Unilateral W/chest  Right  02/10/2014   CLINICAL DATA:  28 year old female with painful palpable nodule along the lower right anterior ribs.  EXAM: RIGHT RIBS AND CHEST - 3+ VIEW  COMPARISON:  08/07/2010 and prior exams  FINDINGS: The cardiomediastinal silhouette is unremarkable.  There is no evidence of focal airspace disease, pulmonary edema, suspicious pulmonary nodule/mass, pleural effusion, or pneumothorax. No bony abnormalities are identified.  No right rib abnormalities are identified.  IMPRESSION: Negative.   Electronically Signed   By: Laveda Abbe M.D.   On: 02/10/2014 10:55     EKG Interpretation None      MDM   Final diagnoses:  Rib contusion, right, initial encounter   Patient with right lower rib tenderness, and she that her ribs are sticking out, however I think that is normal. X-rays obtained to rule out any rib fractures and are negative. Patient has been doing a lot of dancing, and states she is Hitting that area on the pole. I suspect she has a rib contusion. Instructed to not attempt for several days, ice, anti-inflammatories, tramadol. Followup with primary care doctor.   Filed Vitals:   02/10/14 0707 02/10/14 0915 02/10/14 1115 02/10/14 1130  BP: 112/74 109/60 102/76   Pulse: 74 61 57   Temp: 98 F (36.7 C)   98.2 F (36.8 C)  TempSrc: Oral   Oral  Resp: 18 17    SpO2: 99% 99% 98%        [name], PA-C 02/10/14 1551

## 2014-02-10 NOTE — Discharge Instructions (Signed)
Ibuprofen for pain. Ultram for severe pain. Ice. Follow up with your doctor if not improving. Urine analysis and x-ray look normal today.    Chest Contusion A chest contusion is a deep bruise on your chest area. Contusions are the result of an injury that caused bleeding under the skin. A chest contusion may involve bruising of the skin, muscles, or ribs. The contusion may turn blue, purple, or yellow. Minor injuries will give you a painless contusion, but more severe contusions may stay painful and swollen for a few weeks. CAUSES  A contusion is usually caused by a blow, trauma, or direct force to an area of the body. SYMPTOMS   Swelling and redness of the injured area.  Discoloration of the injured area.  Tenderness and soreness of the injured area.  Pain. DIAGNOSIS  The diagnosis can be made by taking a history and performing a physical exam. An X-ray, CT scan, or MRI may be needed to determine if there were any associated injuries, such as broken bones (fractures) or internal injuries. TREATMENT  Often, the best treatment for a chest contusion is resting, icing, and applying cold compresses to the injured area. Deep breathing exercises may be recommended to reduce the risk of pneumonia. Over-the-counter medicines may also be recommended for pain control. HOME CARE INSTRUCTIONS   Put ice on the injured area.  Put ice in a plastic bag.  Place a towel between your skin and the bag.  Leave the ice on for 15-20 minutes, 03-04 times a day.  Only take over-the-counter or prescription medicines as directed by your caregiver. Your caregiver may recommend avoiding anti-inflammatory medicines (aspirin, ibuprofen, and naproxen) for 48 hours because these medicines may increase bruising.  Rest the injured area.  Perform deep-breathing exercises as directed by your caregiver.  Stop smoking if you smoke.  Do not lift objects over 5 pounds (2.3 kg) for 3 days or longer if recommended by  your caregiver. SEEK IMMEDIATE MEDICAL CARE IF:   You have increased bruising or swelling.  You have pain that is getting worse.  You have difficulty breathing.  You have dizziness, weakness, or fainting.  You have blood in your urine or stool.  You cough up or vomit blood.  Your swelling or pain is not relieved with medicines. MAKE SURE YOU:   Understand these instructions.  Will watch your condition.  Will get help right away if you are not doing well or get worse. Document Released: 02/09/2001 Document Revised: 02/09/2012 Document Reviewed: 11/08/2011 [facility] Patient Information 2015 [place], [place]. This information is not intended to replace advice given to you by your health care provider. Make sure you discuss any questions you have with your health care provider. Rib Contusion A rib contusion (bruise) can occur by a blow to the chest or by a fall against a hard object. Usually these will be much better in a couple weeks. If X-rays were taken today and there are no broken bones (fractures), the diagnosis of bruising is made. However, broken ribs may not show up for several days, or may be discovered later on a routine X-ray when signs of healing show up. If this happens to you, it does not mean that something was missed on the X-ray, but simply that it did not show up on the first X-rays. Earlier diagnosis will not usually change the treatment. HOME CARE INSTRUCTIONS   Avoid strenuous activity. Be careful during activities and avoid bumping the injured ribs. Activities that pull on the  injured ribs and cause pain should be avoided, if possible.  For the first day or two, an ice pack used every 20 minutes while awake may be helpful. Put ice in a plastic bag and put a towel between the bag and the skin.  Eat a normal, well-balanced diet. Drink plenty of fluids to avoid constipation.  Take deep breaths several times a day to keep lungs free of infection. Try to cough several  times a day. Splint the injured area with a pillow while coughing to ease pain. Coughing can help prevent pneumonia.  Wear a rib belt or binder only if told to do so by your caregiver. If you are wearing a rib belt or binder, you must do the breathing exercises as directed by your caregiver. If not used properly, rib belts or binders restrict breathing which can lead to pneumonia.  Only take over-the-counter or prescription medicines for pain, discomfort, or fever as directed by your caregiver. SEEK MEDICAL CARE IF:   You or your child has an oral temperature above 102 F (38.9 C).  Your baby is older than 3 months with a rectal temperature of 100.5 F (38.1 C) or higher for more than 1 day.  You develop a cough, with thick or bloody sputum. SEEK IMMEDIATE MEDICAL CARE IF:   You have difficulty breathing.  You feel sick to your stomach (nausea), have vomiting or belly (abdominal) pain.  You have worsening pain, not controlled with medications, or there is a change in the location of the pain.  You develop sweating or radiation of the pain into the arms, jaw or shoulders, or become light headed or faint.  You or your child has an oral temperature above 102 F (38.9 C), not controlled by medicine.  Your or your baby is older than 3 months with a rectal temperature of 102 F (38.9 C) or higher.  Your baby is 22 months old or younger with a rectal temperature of 100.4 F (38 C) or higher. MAKE SURE YOU:   Understand these instructions.  Will watch your condition.  Will get help right away if you are not doing well or get worse. Document Released: 02/09/2001 Document Revised: 09/11/2012 Document Reviewed: 01/03/2008 [facility] Patient Information 2015 [place], [place]. This information is not intended to replace advice given to you by your health care provider. Make sure you discuss any questions you have with your health care provider.

## 2014-02-10 NOTE — ED Provider Notes (Signed)
Medical screening examination/treatment/procedure(s) were performed by non-physician practitioner and as supervising physician I was immediately available for consultation/collaboration.   EKG Interpretation None       Kamren Heintzelman L Cletus Paris, MD 02/10/14 2151 

## 2014-03-25 ENCOUNTER — Encounter (HOSPITAL_COMMUNITY): Payer: Self-pay | Admitting: Emergency Medicine

## 2014-03-25 ENCOUNTER — Emergency Department (HOSPITAL_COMMUNITY)
Admission: EM | Admit: 2014-03-25 | Discharge: 2014-03-25 | Disposition: A | Discharge status: Home / Self Care | Payer: Medicaid Other | Source: Home / Self Care | Attending: Family Medicine | Admitting: Family Medicine

## 2014-03-25 DIAGNOSIS — A6000 Herpesviral infection of urogenital system, unspecified: Secondary | ICD-10-CM

## 2014-03-25 LAB — POCT URINALYSIS DIP (DEVICE)
BILIRUBIN URINE: NEGATIVE
Glucose, UA: NEGATIVE mg/dL
Ketones, ur: NEGATIVE mg/dL
NITRITE: NEGATIVE
PH: 7 (ref 5.0–8.0)
Protein, ur: NEGATIVE mg/dL
Specific Gravity, Urine: 1.02 (ref 1.005–1.030)
Urobilinogen, UA: 0.2 mg/dL (ref 0.0–1.0)

## 2014-03-25 LAB — POCT PREGNANCY, URINE: Preg Test, Ur: NEGATIVE

## 2014-03-25 MED ORDER — VALACYCLOVIR HCL 500 MG PO TABS: 500.0000 mg | ORAL_TABLET | Freq: Two times a day (BID) | ORAL | Status: DC

## 2014-03-25 NOTE — ED Notes (Signed)
Pt states that she is out of her medication to help with herpes out break. Needs it refilled because she feels outbreak approaching. Pt denies pain at this time no acute distress noted

## 2014-03-25 NOTE — ED Provider Notes (Addendum)
CSN: [id number]     Arrival date & time 03/25/14  1737 History   None    Chief Complaint  Patient presents with  . Medication Refill   (Consider location/radiation/quality/duration/timing/severity/associated sxs/prior Treatment) Patient is a 28 y.o. female presenting with rash. The history is provided by the patient.  Rash Location:  Ano-genital Ano-genital rash location:  Vulva Quality: blistering   Severity:  Mild Duration:  2 days Chronicity:  Recurrent Context comment:  H/o herpes and is out of valtrex for recurrence   Past Medical History  Diagnosis Date  . Sickle cell trait   . Herpes    History reviewed. No pertinent past surgical history. History reviewed. No pertinent family history. History  Substance Use Topics  . Smoking status: Current Some Day Smoker -- 1.00 packs/day    Types: Cigarettes  . Smokeless tobacco: Not on file  . Alcohol Use: Yes     Comment: pt states 8 to 10 shots nightly   OB History   Grav Para Term Preterm Abortions TAB SAB Ect Mult Living                 Review of Systems  Constitutional: Negative.   Gastrointestinal: Negative.   Genitourinary: Negative for vaginal bleeding and vaginal discharge.  Skin: Positive for rash.    Allergies  Review of patient's allergies indicates no known allergies.  Home Medications   Prior to Admission medications   Medication Sig Start Date End Date Taking? Authorizing Provider  valACYclovir (VALTREX) 500 MG tablet Take 1 tablet (500 mg total) by mouth 2 (two) times daily. 02/10/14  Yes [name], PA-C  ibuprofen (ADVIL,MOTRIN) 200 MG tablet Take 400 mg by mouth every 6 (six) hours as needed for mild pain.    Historical Provider, MD  ibuprofen (ADVIL,MOTRIN) 600 MG tablet Take 1 tablet (600 mg total) by mouth every 6 (six) hours as needed. 02/10/14   [name], PA-C  traMADol (ULTRAM) 50 MG tablet Take 1 tablet (50 mg total) by mouth every 6 (six) hours as needed. 02/10/14    [name], PA-C  valACYclovir (VALTREX) 500 MG tablet Take 1 tablet (500 mg total) by mouth 2 (two) times daily. For 3 days for recurrences 03/25/14   Linna HoffJames D Brenlyn Beshara, MD   BP 109/63  Pulse 60  Temp(Src) 97.4 F (36.3 C) (Oral)  Resp 14  SpO2 100%  LMP 03/08/2014 Physical Exam  Nursing note and vitals reviewed. Constitutional: She is oriented to person, place, and time. She appears well-developed and well-nourished.  Genitourinary:  Pelvic exam not done as she is aware of problem.  Neurological: She is alert and oriented to person, place, and time.    ED Course  Procedures (including critical care time) Labs Review Labs Reviewed  POCT URINALYSIS DIP (DEVICE) - Abnormal; Notable for the following:    Hgb urine dipstick TRACE (*)    Leukocytes, UA SMALL (*)    All other components within normal limits  POCT PREGNANCY, URINE    Imaging Review No results found.   MDM   1. 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

## 2014-05-03 ENCOUNTER — Encounter (HOSPITAL_COMMUNITY): Payer: Self-pay | Admitting: Emergency Medicine

## 2014-05-03 ENCOUNTER — Emergency Department (HOSPITAL_COMMUNITY)
Admission: EM | Admit: 2014-05-03 | Discharge: 2014-05-03 | Disposition: A | Discharge status: Home / Self Care | Payer: Medicaid Other | Source: Home / Self Care

## 2014-05-03 DIAGNOSIS — B009 Herpesviral infection, unspecified: Secondary | ICD-10-CM

## 2014-05-03 MED ORDER — VALACYCLOVIR HCL 500 MG PO TABS: 500.0000 mg | ORAL_TABLET | Freq: Two times a day (BID) | ORAL | Status: DC

## 2014-05-03 NOTE — ED Provider Notes (Signed)
CSN: [id number]     Arrival date & time 05/03/14  1704 History   None    Chief Complaint  Patient presents with  . Medication Refill   (Consider location/radiation/quality/duration/timing/severity/associated sxs/prior Treatment) HPI  She is a 28 year old woman here for medication refill. She takes Valtrex for recurrent genital herpes. She states outbreaks typically occur around her cycle or in times of stress. She is in the process of getting a primary care provider, but does not have an appointment until the middle of January.  Past Medical History  Diagnosis Date  . Sickle cell trait   . Herpes    History reviewed. No pertinent past surgical history. No family history on file. History  Substance Use Topics  . Smoking status: Current Some Day Smoker -- 1.00 packs/day    Types: Cigarettes  . Smokeless tobacco: Not on file  . Alcohol Use: Yes     Comment: pt states 8 to 10 shots nightly   OB History    No data available     Review of Systems  All other systems reviewed and are negative.   Allergies  Review of patient's allergies indicates no known allergies.  Home Medications   Prior to Admission medications   Medication Sig Start Date End Date Taking? Authorizing Provider  ibuprofen (ADVIL,MOTRIN) 200 MG tablet Take 400 mg by mouth every 6 (six) hours as needed for mild pain.    Historical Provider, MD  ibuprofen (ADVIL,MOTRIN) 600 MG tablet Take 1 tablet (600 mg total) by mouth every 6 (six) hours as needed. 02/10/14   [name], PA-C  traMADol (ULTRAM) 50 MG tablet Take 1 tablet (50 mg total) by mouth every 6 (six) hours as needed. 02/10/14   [name], PA-C  valACYclovir (VALTREX) 500 MG tablet Take 1 tablet (500 mg total) by mouth 2 (two) times daily. For 3 days for recurrences 05/03/14   Charm RingsErin J Salif Tay, MD   BP 125/78 mmHg  Pulse 79  Temp(Src) 98.2 F (36.8 C) (Oral)  Resp 16  SpO2 97%  LMP 04/04/2014 Physical Exam  Constitutional: She is  oriented to person, place, and time. She appears well-developed and well-nourished. No distress.  Cardiovascular: Normal rate.   Pulmonary/Chest: Effort normal.  Neurological: She is alert and oriented to person, place, and time.    ED Course  Procedures (including critical care time) Labs Review Labs Reviewed - No data to display  Imaging Review No results found.   MDM   1. Herpes    Prescription for Valtrex provided. Follow-up as needed.    Charm RingsErin J Mariame Rybolt, MD 05/03/14 [phone]

## 2014-05-03 NOTE — Discharge Instructions (Signed)
Use Valtrex twice a day for 3 days at the start of on outbreak.

## 2014-05-03 NOTE — ED Notes (Signed)
Patient presents for refill on Valtrex. Patient reports she has had to come here for refills because she was placed on a waiting list for a PCP and does not have an appt for 6 weeks. Patient ran out on Tuesday 12/1. In NAD. No new problems.

## 2014-08-31 ENCOUNTER — Encounter (HOSPITAL_COMMUNITY): Payer: Self-pay | Admitting: Emergency Medicine

## 2014-08-31 ENCOUNTER — Emergency Department (HOSPITAL_COMMUNITY)
Admission: EM | Admit: 2014-08-31 | Discharge: 2014-08-31 | Disposition: A | Discharge status: Home / Self Care | Payer: Medicaid Other | Source: Home / Self Care | Attending: Family Medicine | Admitting: Family Medicine

## 2014-08-31 DIAGNOSIS — A6000 Herpesviral infection of urogenital system, unspecified: Secondary | ICD-10-CM | POA: Diagnosis not present

## 2014-08-31 MED ORDER — VALACYCLOVIR HCL 500 MG PO TABS: 500.0000 mg | ORAL_TABLET | Freq: Two times a day (BID) | ORAL | Status: DC

## 2014-08-31 NOTE — Discharge Instructions (Signed)
Genital Herpes °Genital herpes is a sexually transmitted disease. This means that it is a disease passed by having sex with an infected person. There is no cure for genital herpes. The time between attacks can be months to years. The virus may live in a person but produce no problems (symptoms). This infection can be passed to a baby as it travels down the birth canal (vagina). In a newborn, this can cause central nervous system damage, eye damage, or even death. The virus that causes genital herpes is usually HSV-2 virus. The virus that causes oral herpes is usually HSV-1. The diagnosis (learning what is wrong) is made through culture results. °SYMPTOMS  °Usually symptoms of pain and itching begin a few days to a week after contact. It first appears as small blisters that progress to small painful ulcers which then scab over and heal after several days. It affects the outer genitalia, birth canal, cervix, penis, anal area, buttocks, and thighs. °HOME CARE INSTRUCTIONS  °· Keep ulcerated areas dry and clean. °· Take medications as directed. Antiviral medications can speed up healing. They will not prevent recurrences or cure this infection. These medications can also be taken for suppression if there are frequent recurrences. °· While the infection is active, it is contagious. Avoid all sexual contact during active infections. °· Condoms may help prevent spread of the herpes virus. °· Practice safe sex. °· Wash your hands thoroughly after touching the genital area. °· Avoid touching your eyes after touching your genital area. °· Inform your caregiver if you have had genital herpes and become pregnant. It is your responsibility to insure a safe outcome for your baby in this pregnancy. °· Only take over-the-counter or prescription medicines for pain, discomfort, or fever as directed by your caregiver. °SEEK MEDICAL CARE IF:  °· You have a recurrence of this infection. °· You do not respond to medications and are not  improving. °· You have new sources of pain or discharge which have changed from the original infection. °· You have an oral temperature above 102° F (38.9° C). °· You develop abdominal pain. °· You develop eye pain or signs of eye infection. °Document Released: 05/14/2000 Document Revised: 08/09/2011 Document Reviewed: 06/04/2009 °[employer]® Patient Information ©2015 [employer]. This information is not intended to replace advice given to you by your health care provider. Make sure you discuss any questions you have with your health care provider. ° °

## 2014-08-31 NOTE — ED Notes (Signed)
Pt is here for valtrex refill

## 2014-08-31 NOTE — ED Provider Notes (Signed)
CSN: [id number]     Arrival date & time 08/31/14  1609 History   First MD Initiated Contact with Patient 08/31/14 1700     No chief complaint on file.  (Consider location/radiation/quality/duration/timing/severity/associated sxs/prior Treatment) HPI Comments: Patient is a very unpleasant 29 yo black female who presents for a RX for Valtrex. She states that she is on a waiting list for a PCP. "That is all she needs", because she has a breakout. No abdominal pain, urinary symptoms or fevers.   The history is provided by the patient.    Past Medical History  Diagnosis Date  . Sickle cell trait   . Herpes    No past surgical history on file. No family history on file. History  Substance Use Topics  . Smoking status: Current Some Day Smoker -- 1.00 packs/day    Types: Cigarettes  . Smokeless tobacco: Not on file  . Alcohol Use: Yes     Comment: pt states 8 to 10 shots nightly   OB History    No data available     Review of Systems  All other systems reviewed and are negative.   Allergies  Review of patient's allergies indicates no known allergies.  Home Medications   Prior to Admission medications   Medication Sig Start Date End Date Taking? Authorizing Provider  ibuprofen (ADVIL,MOTRIN) 200 MG tablet Take 400 mg by mouth every 6 (six) hours as needed for mild pain.    Historical Provider, MD  ibuprofen (ADVIL,MOTRIN) 600 MG tablet Take 1 tablet (600 mg total) by mouth every 6 (six) hours as needed. 02/10/14   Tatyana Kirichenko, PA-C  traMADol (ULTRAM) 50 MG tablet Take 1 tablet (50 mg total) by mouth every 6 (six) hours as needed. 02/10/14   Tatyana Kirichenko, PA-C  valACYclovir (VALTREX) 500 MG tablet Take 1 tablet (500 mg total) by mouth 2 (two) times daily. For 3 days for recurrences 08/31/14   Riki SheerMichelle G Hannan Hutmacher, PA-C   BP 104/66 mmHg  Pulse 74  Temp(Src) 98.4 F (36.9 C) (Oral)  Resp 18  SpO2 98% Physical Exam  Constitutional: She is oriented to person, place, and  time. She appears well-developed and well-nourished. No distress.  Appeared angry upon entering the room  Neurological: She is alert and oriented to person, place, and time.  Skin: Skin is warm.  Nursing note and vitals reviewed.   ED Course  Procedures (including critical care time) Labs Review Labs Reviewed - No data to display  Imaging Review No results found.   MDM   1. 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 Unhappy that we have moved back to our permanent location. RX given to patient for Valtrex. Again asked that she establish with a PCP.     Riki SheerMichelle G Wataru Mccowen, PA-C 08/31/14 1725

## 2015-07-02 ENCOUNTER — Ambulatory Visit: Payer: Medicaid Other | Admitting: Internal Medicine

## 2015-07-10 ENCOUNTER — Ambulatory Visit: Payer: Medicaid Other | Admitting: Internal Medicine

## 2015-07-14 ENCOUNTER — Encounter: Payer: Self-pay | Admitting: Internal Medicine

## 2015-07-14 ENCOUNTER — Ambulatory Visit (INDEPENDENT_AMBULATORY_CARE_PROVIDER_SITE_OTHER): Payer: Self-pay | Admitting: Internal Medicine

## 2015-07-14 VITALS — BP 113/75 | HR 72 | Temp 98.2°F | Ht 61.0 in | Wt 107.9 lb

## 2015-07-14 DIAGNOSIS — R0781 Pleurodynia: Secondary | ICD-10-CM

## 2015-07-14 DIAGNOSIS — Z8742 Personal history of other diseases of the female genital tract: Secondary | ICD-10-CM

## 2015-07-14 DIAGNOSIS — A6000 Herpesviral infection of urogenital system, unspecified: Secondary | ICD-10-CM

## 2015-07-14 DIAGNOSIS — Z298 Encounter for other specified prophylactic measures: Secondary | ICD-10-CM

## 2015-07-14 LAB — D-DIMER, QUANTITATIVE: D-Dimer, Quant: 0.83 ug/mL-FEU — ABNORMAL HIGH (ref 0.00–0.50)

## 2015-07-14 MED ORDER — VALACYCLOVIR HCL 500 MG PO TABS: 1000.0000 mg | ORAL_TABLET | Freq: Two times a day (BID) | ORAL | Status: DC

## 2015-07-14 MED ORDER — VALACYCLOVIR HCL 500 MG PO TABS: 500.0000 mg | ORAL_TABLET | Freq: Two times a day (BID) | ORAL | Status: DC

## 2015-07-14 NOTE — Patient Instructions (Signed)
We have prescribed the medication you requested, take one tablet 2 times a day for 3 days.  Also we will do one blood work to ensure you are not having a blood clot in your lungs. The chest pain you are having is most likely due to trauma/you must have hit it against something, take ibuprofen as needed when you have the pain. And we will see you in  Month to address your other issues.

## 2015-07-15 ENCOUNTER — Encounter: Payer: Self-pay | Admitting: Internal Medicine

## 2015-07-15 DIAGNOSIS — A6000 Herpesviral infection of urogenital system, unspecified: Secondary | ICD-10-CM | POA: Insufficient documentation

## 2015-07-15 DIAGNOSIS — R0781 Pleurodynia: Secondary | ICD-10-CM

## 2015-07-15 HISTORY — DX: Pleurodynia: R07.81

## 2015-07-15 NOTE — Progress Notes (Addendum)
Internal Medicine Clinic Attending  Case discussed with Dr. Emokpae at the time of the visit.  We reviewed the resident's history and exam and pertinent patient test results.  I agree with the assessment, diagnosis, and plan of care documented in the resident's note.  

## 2015-07-15 NOTE — Addendum Note (Signed)
Addended by: Onnie Boer on: 07/15/2015 02:17 PM   Modules accepted: Level of Service

## 2015-07-15 NOTE — Progress Notes (Addendum)
Patient ID: Teresa Burns, female   DOB: 1986/04/24, 30 y.o.   MRN: [id number]   Subjective:   Patient ID: Teresa Burns female   DOB: February 09, 1986 30 y.o.   MRN: [id number]  HPI: Ms.Teresa Burns is a 30 y.o. with PMH- Sickle cell trait and recurrent herpes infection. Presented to establish care here, with multiple complaints, incuding wanting a  Refill of her meds- vlatrex , chest pain, bump on her chest. 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 She denies any trauma, and says she is sure she did not hit it against any thing. She has had 4 previous episodes. Pain lasted for a while ?duration. She denies pain radiating to any part of her chest, jaw or arm and pain was worse with deep breathing. She says he was already short of breath from her activity and cannot tell if she was more short of breath than previous. No cough or URTI. She has no chest pain presently. She denies any hx of blood clots- family and personal, no family hx of premature CAD, not on birth control, denies leg pain or swelling, no recent travel. She is a current smoker, drinks alcohol, uses Marijuana but denies use of other illicit drugs. She has one kid.   Past Medical History  Diagnosis Date  . Sickle cell trait (HCC)   . Herpes    Current Outpatient Prescriptions  Medication Sig Dispense Refill  . ibuprofen (ADVIL,MOTRIN) 200 MG tablet Take 400 mg by mouth every 6 (six) hours as needed for mild pain.    Marland Kitchen ibuprofen (ADVIL,MOTRIN) 600 MG tablet Take 1 tablet (600 mg total) by mouth every 6 (six) hours as needed. 30 tablet 0  . traMADol (ULTRAM) 50 MG tablet Take 1 tablet (50 mg total) by mouth every 6 (six) hours as needed. 15 tablet 0  . valACYclovir (VALTREX) 500 MG tablet Take 1 tablet (500 mg total) by mouth 2 (two) times daily. 6 tablet 0   No current facility-administered  medications for this visit.   Family History  Problem Relation Age of Onset  . Cancer Maternal Grandmother        No family hx of premature coronary artery dx or blood clots.  Social History   Social History  . Marital Status: Single    Spouse Name: N/A  . Number of Children: N/A  . Years of Education: N/A   Social History Main Topics  . Smoking status: Current Some Day Smoker -- 1.00 packs/day    Types: Cigarettes  . Smokeless tobacco: Not on file     Comment: 1/2PPD  . Alcohol Use: 0.0 oz/week    0 Standard drinks or equivalent per week     Comment: pt states 8 to 10 shots nightly  . Drug Use: Yes    Special: Cocaine, Marijuana  . Sexual Activity: Yes   Other Topics Concern  . Not on file   Social History Narrative   Review of Systems: CONSTITUTIONAL- No Fever, weightloss. SKIN- No Rash, colour changes or itching. HEAD- No Headache or dizziness. Mouth/throat- No Sorethroat, dentures, or bleeding gums. RESPIRATORY- No Cough or SOB. CARDIAC- No Palpitations, or chest pain. GI- No  vomiting, diarrhoea, abd pain. URINARY- No Frequency, or dysuria. NEUROLOGIC- No Numbness, or burning. [facility]- Denies depression or anxiety.  Objective:  Physical Exam: Ceasar Mons  Vitals:   07/14/15 1606  BP: 113/75  Pulse: 72  Temp: 98.2 F (36.8 C)  TempSrc: Oral  Height:  (1.549 m)  Weight: 107 lb 14.4 oz (48.943 kg)  SpO2: 100%   GENERAL- alert, co-operative, appears as stated age, not in any distress. HEENT- Atraumatic, normocephalic, PERRL, neck supple. CARDIAC- RRR, no murmurs, rubs or gallops, no tenderness to palpation- left side, slight swelling right side of chest, soft, appears to have been traumatized, underlying rib palpated. RESP- Moving equal volumes of air, and clear to auscultation bilaterally, no wheezes or crackles. ABDOMEN- Soft, nontender, bowel sounds present. NEURO- No obvious Cr N abnormality, strenght upper and lower extremities- 5/5, Gait-  Normal. EXTREMITIES- pulse 2+, symmetric, no pedal edema, no tenderness or redness, no swelling. SKIN- Warm, dry, No rash or lesion. PSYCH- Normal mood and affect, appropriate thought content and speech.  Assessment & Plan:  The patient's case and plan of care was discussed with attending physician, Dr. Erlinda Hong.  Please see problem based charting for assessment and plan.

## 2015-07-15 NOTE — Assessment & Plan Note (Signed)
Patient is young, slim, active, not on birthcontrol, no family hx of blood clots, premature CAD. Chest pain is worse with deep breathing.   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 Well score- 0.  Plan- Will get Ddimer- as pt appears relatively healthy. Pt counselled that if test result is positive she will need further imaging.  Addendum- DDimer-mildly elevated- 0.83. Called patient back, she left her phone with her mother. Told her test results warrant that imaging- CTA chest be done. Pt has no insurance and is currently working on getting medicaid or the orange card. Pt will like imaging done. Will place order.

## 2015-07-15 NOTE — Assessment & Plan Note (Addendum)
Hx of recurrent genital herpes. She has no lesions now, but can tell when she is about to have it. Usually comes around her period. She is requesting a prescription. Plan- Valacyclovir-  BID for 3 days. - HIV test after she gets the orange card/medicaid - Counselled about using barrier protection, pt voiced understanding.

## 2015-07-16 ENCOUNTER — Emergency Department (HOSPITAL_COMMUNITY): Payer: Self-pay

## 2015-07-16 ENCOUNTER — Emergency Department (HOSPITAL_COMMUNITY)
Admission: EM | Admit: 2015-07-16 | Discharge: 2015-07-17 | Disposition: A | Discharge status: Home / Self Care | Payer: Self-pay | Attending: Emergency Medicine | Admitting: Emergency Medicine

## 2015-07-16 ENCOUNTER — Encounter: Payer: Self-pay | Admitting: Internal Medicine

## 2015-07-16 ENCOUNTER — Encounter (HOSPITAL_COMMUNITY): Payer: Self-pay | Admitting: Family Medicine

## 2015-07-16 DIAGNOSIS — R0789 Other chest pain: Secondary | ICD-10-CM | POA: Insufficient documentation

## 2015-07-16 DIAGNOSIS — Z862 Personal history of diseases of the blood and blood-forming organs and certain disorders involving the immune mechanism: Secondary | ICD-10-CM | POA: Insufficient documentation

## 2015-07-16 DIAGNOSIS — F17210 Nicotine dependence, cigarettes, uncomplicated: Secondary | ICD-10-CM | POA: Insufficient documentation

## 2015-07-16 DIAGNOSIS — Z8619 Personal history of other infectious and parasitic diseases: Secondary | ICD-10-CM | POA: Insufficient documentation

## 2015-07-16 DIAGNOSIS — R0602 Shortness of breath: Secondary | ICD-10-CM | POA: Insufficient documentation

## 2015-07-16 DIAGNOSIS — Z3202 Encounter for pregnancy test, result negative: Secondary | ICD-10-CM | POA: Insufficient documentation

## 2015-07-16 DIAGNOSIS — Z79899 Other long term (current) drug therapy: Secondary | ICD-10-CM | POA: Insufficient documentation

## 2015-07-16 LAB — CBC WITH DIFFERENTIAL/PLATELET
BASOS PCT: 1 %
Basophils Absolute: 0 10*3/uL (ref 0.0–0.1)
EOS ABS: 0 10*3/uL (ref 0.0–0.7)
Eosinophils Relative: 1 %
HEMATOCRIT: 37.3 % (ref 36.0–46.0)
HEMOGLOBIN: 13.1 g/dL (ref 12.0–15.0)
Lymphocytes Relative: 45 %
Lymphs Abs: 2 10*3/uL (ref 0.7–4.0)
MCH: 31.2 pg (ref 26.0–34.0)
MCHC: 35.1 g/dL (ref 30.0–36.0)
MCV: 88.8 fL (ref 78.0–100.0)
Monocytes Absolute: 0.7 10*3/uL (ref 0.1–1.0)
Monocytes Relative: 17 %
NEUTROS PCT: 36 %
Neutro Abs: 1.5 10*3/uL — ABNORMAL LOW (ref 1.7–7.7)
PLATELETS: 223 10*3/uL (ref 150–400)
RBC: 4.2 MIL/uL (ref 3.87–5.11)
RDW: 12.7 % (ref 11.5–15.5)
WBC: 4.3 10*3/uL (ref 4.0–10.5)

## 2015-07-16 LAB — I-STAT CHEM 8, ED
BUN: 7 mg/dL (ref 6–20)
CALCIUM ION: 1.19 mmol/L (ref 1.12–1.23)
Chloride: 103 mmol/L (ref 101–111)
Creatinine, Ser: 0.6 mg/dL (ref 0.44–1.00)
GLUCOSE: 87 mg/dL (ref 65–99)
HCT: 41 % (ref 36.0–46.0)
HEMOGLOBIN: 13.9 g/dL (ref 12.0–15.0)
Potassium: 4.1 mmol/L (ref 3.5–5.1)
Sodium: 140 mmol/L (ref 135–145)
TCO2: 25 mmol/L (ref 0–100)

## 2015-07-16 LAB — I-STAT TROPONIN, ED: TROPONIN I, POC: 0 ng/mL (ref 0.00–0.08)

## 2015-07-16 LAB — POC URINE PREG, ED: PREG TEST UR: NEGATIVE

## 2015-07-16 NOTE — ED Provider Notes (Signed)
CSN: [id number]     Arrival date & time 07/16/15  1432 History   First MD Initiated Contact with Patient 07/16/15 2007     Chief Complaint  Patient presents with  . Chest Pain     (Consider location/radiation/quality/duration/timing/severity/associated sxs/prior Treatment) HPI Comments: Patient sent from internal medicine clinic for chest pain, shortness of breath and elevated d-dimer. 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 The pain is associated with shortness of breath and pain with deep breathing. Is worse with palpation and movement. She states she had a near syncopal episode Saturday night because the pain was so bad. She's been taking ibuprofen without relief. The pain is worse with palpation and movement of her left side. No cough or fever. No leg pain or swelling. No abdominal pain, nausea or vomiting. D-dimer was positive in her doctor's office. She has no history of blood clots and is not on birth control.  The history is provided by the patient.    Past Medical History  Diagnosis Date  . Sickle cell trait (HCC)   . Herpes    History reviewed. No pertinent past surgical history. Family History  Problem Relation Age of Onset  . Cancer Maternal Grandmother    Social History  Substance Use Topics  . Smoking status: Current Some Day Smoker -- 1.00 packs/day    Types: Cigarettes  . Smokeless tobacco: None     Comment: 1/2PPD  . Alcohol Use: 0.0 oz/week    0 Standard drinks or equivalent per week     Comment: pt states 8 to 10 shots nightly   OB History    No data available     Review of Systems  Constitutional: Negative for fever, activity change and appetite change.  HENT: Negative for congestion and rhinorrhea.   Respiratory: Positive for shortness of breath. Negative for cough and chest tightness.   Cardiovascular: Positive for chest pain.  Gastrointestinal: Negative for nausea, vomiting and abdominal pain.   Genitourinary: Negative for dysuria, hematuria, vaginal bleeding and vaginal discharge.  Musculoskeletal: Negative for myalgias and arthralgias.  Skin: Negative for wound.  A complete 10 system review of systems was obtained and all systems are negative except as noted in the HPI and PMH.      Allergies  Review of patient's allergies indicates no known allergies.  Home Medications   Prior to Admission medications   Medication Sig Start Date End Date Taking? Authorizing Provider  acetaminophen (TYLENOL) 325 MG tablet Take 650 mg by mouth every 6 (six) hours as needed for headache.   Yes Historical Provider, MD  valACYclovir (VALTREX) 500 MG tablet Take 1 tablet (500 mg total) by mouth 2 (two) times daily. 07/14/15  Yes Ejiroghene Wendall Stade, MD  ibuprofen (ADVIL,MOTRIN) 600 MG tablet Take 1 tablet (600 mg total) by mouth every 6 (six) hours as needed. 02/10/14   Tatyana Kirichenko, PA-C  naproxen (NAPROSYN) 500 MG tablet Take 1 tablet (500 mg total) by mouth 2 (two) times daily. 07/17/15   Glynn Octave, MD  traMADol (ULTRAM) 50 MG tablet Take 1 tablet (50 mg total) by mouth every 6 (six) hours as needed. 02/10/14   Tatyana Kirichenko, PA-C   BP 107/75 mmHg  Pulse 69  Temp(Src) 98.1 F (36.7 C)  Resp 18  SpO2 100%  LMP 07/03/2015 (Exact Date) Physical Exam  Constitutional: She is oriented to person, place, and time. She appears well-developed and well-nourished. No distress.  HENT:  Head: Normocephalic  and atraumatic.  Mouth/Throat: Oropharynx is clear and moist. No oropharyngeal exudate.  Eyes: Conjunctivae and EOM are normal. Pupils are equal, round, and reactive to light.  Neck: Normal range of motion. Neck supple.  No meningismus.  Cardiovascular: Normal rate, regular rhythm, normal heart sounds and intact distal pulses.   No murmur heard. Pulmonary/Chest: Effort normal and breath sounds normal. No respiratory distress. She exhibits tenderness.  Chaperone present,  reproducible midsternal tenderness as well as tenderness on the left breast. No rash.  Abdominal: Soft. There is no tenderness. There is no rebound and no guarding.  Musculoskeletal: Normal range of motion. She exhibits no edema or tenderness.  Neurological: She is alert and oriented to person, place, and time. No cranial nerve deficit. She exhibits normal muscle tone. Coordination normal.  No ataxia on finger to nose bilaterally. No pronator drift. 5/5 strength throughout. CN 2-12 intact.Equal grip strength. Sensation intact.   Skin: Skin is warm.  Psychiatric: She has a normal mood and affect. Her behavior is normal.  Nursing note and vitals reviewed.   ED Course  Procedures (including critical care time) Labs Review Labs Reviewed  CBC WITH DIFFERENTIAL/PLATELET - Abnormal; Notable for the following:    Neutro Abs 1.5 (*)    All other components within normal limits  I-STAT CHEM 8, ED  I-STAT TROPOININ, ED  POC URINE PREG, ED    Imaging Review Dg Chest 2 View  07/16/2015  CLINICAL DATA:  Recent syncope. Left chest pain. History of sickle cell trait EXAM: CHEST  2 VIEW COMPARISON:  February 10, 2014 FINDINGS: Lungs are clear. Heart size and pulmonary vascularity are normal. No adenopathy. No bone lesions. No pneumothorax. IMPRESSION: No edema or consolidation. Electronically Signed   By: Bretta Bang III M.D.   On: 07/16/2015 15:32   Ct Angio Chest Pe W/cm &/or Wo Cm  07/17/2015  CLINICAL DATA:  Chest tightness and shortness of breath. Patient passed out on Saturday. Elevated D-dimer. EXAM: CT ANGIOGRAPHY CHEST WITH CONTRAST TECHNIQUE: Multidetector CT imaging of the chest was performed using the standard protocol during bolus administration of intravenous contrast. Multiplanar CT image reconstructions and MIPs were obtained to evaluate the vascular anatomy. CONTRAST:  75mL OMNIPAQUE IOHEXOL 350 MG/ML SOLN COMPARISON:  08/07/2010 FINDINGS: Technically adequate study with good  opacification of the central and segmental pulmonary arteries. No focal filling defects demonstrated. No evidence of significant pulmonary embolus. Normal heart size. Normal caliber thoracic aorta. Esophagus is decompressed. No significant lymphadenopathy in the chest. 7 mm nodule in the right upper lung medially was not present on the previous study. This may be inflammatory. No other focal nodules identified. No focal consolidation or airspace disease. Airways are patent. No pleural effusions. No pneumothorax. Included portions of the upper abdominal organs are grossly unremarkable. No destructive bone lesions. Review of the MIP images confirms the above findings. IMPRESSION: No evidence of significant pulmonary embolus. 7 mm nodule in the right upper lung is nonspecific but possibly inflammatory. Electronically Signed   By: Burman Nieves M.D.   On: 07/17/2015 00:46   I have personally reviewed and evaluated these images and lab results as part of my medical decision-making.   EKG Interpretation None      MDM   Final diagnoses:  Atypical chest pain   chest pain or shortness of breath for the past 3 days. Sent from PCPs office with elevated d-dimer. Pain is reproducible. EKG is nonischemic. Chest x-ray is negative.   CT is negative for pulmonary embolism  or other acute pathology. Suspect musculoskeletal chest pain will treat with anti-inflammatories. Follow up PCP. Return precautions discussed.   ED ECG REPORT   Date: 07/16/2015  Rate: 68  Rhythm: normal sinus rhythm  QRS Axis: normal  Intervals: normal  ST/T Wave abnormalities: nonspecific ST changes  Conduction Disutrbances:none  Narrative Interpretation:   Old EKG Reviewed: none available  I have personally reviewed the EKG tracing and agree with the computerized printout as noted.   Glynn Octave, MD 07/17/15 [phone]

## 2015-07-16 NOTE — ED Notes (Signed)
Pt sent here from internal medicine clinic with chest pain, SOB  And elevated d dimer. Sent here r/o blood clot.

## 2015-07-17 ENCOUNTER — Encounter (HOSPITAL_COMMUNITY): Payer: Self-pay

## 2015-07-17 ENCOUNTER — Emergency Department (HOSPITAL_COMMUNITY): Payer: Self-pay

## 2015-07-17 MED ORDER — IOHEXOL 350 MG/ML SOLN
75.0000 mL | Freq: Once | INTRAVENOUS | Status: AC | PRN
  Administered 2015-07-17: 75 mL via INTRAVENOUS

## 2015-07-17 MED ORDER — NAPROXEN 500 MG PO TABS: 500.0000 mg | ORAL_TABLET | Freq: Two times a day (BID) | ORAL | Status: DC

## 2015-07-17 NOTE — ED Notes (Signed)
Patient transported to CT 

## 2015-07-17 NOTE — Discharge Instructions (Signed)

## 2015-07-17 NOTE — ED Notes (Signed)
Pt refusing cardiac leads and O2 sensor

## 2015-07-21 ENCOUNTER — Ambulatory Visit (HOSPITAL_COMMUNITY): Payer: Self-pay

## 2015-07-29 ENCOUNTER — Emergency Department (HOSPITAL_COMMUNITY)
Admission: EM | Admit: 2015-07-29 | Discharge: 2015-07-30 | Disposition: A | Discharge status: Home / Self Care | Payer: Medicaid Other | Attending: Emergency Medicine | Admitting: Emergency Medicine

## 2015-07-29 ENCOUNTER — Encounter (HOSPITAL_COMMUNITY): Payer: Self-pay | Admitting: *Deleted

## 2015-07-29 ENCOUNTER — Emergency Department (HOSPITAL_COMMUNITY): Payer: Medicaid Other

## 2015-07-29 DIAGNOSIS — Z862 Personal history of diseases of the blood and blood-forming organs and certain disorders involving the immune mechanism: Secondary | ICD-10-CM | POA: Diagnosis not present

## 2015-07-29 DIAGNOSIS — J069 Acute upper respiratory infection, unspecified: Secondary | ICD-10-CM

## 2015-07-29 DIAGNOSIS — Z8619 Personal history of other infectious and parasitic diseases: Secondary | ICD-10-CM | POA: Diagnosis not present

## 2015-07-29 DIAGNOSIS — F17210 Nicotine dependence, cigarettes, uncomplicated: Secondary | ICD-10-CM | POA: Diagnosis not present

## 2015-07-29 DIAGNOSIS — R0789 Other chest pain: Secondary | ICD-10-CM | POA: Insufficient documentation

## 2015-07-29 DIAGNOSIS — R079 Chest pain, unspecified: Secondary | ICD-10-CM | POA: Diagnosis present

## 2015-07-29 LAB — CBC
HCT: 35.8 % — ABNORMAL LOW (ref 36.0–46.0)
Hemoglobin: 12.4 g/dL (ref 12.0–15.0)
MCH: 31.5 pg (ref 26.0–34.0)
MCHC: 34.6 g/dL (ref 30.0–36.0)
MCV: 90.9 fL (ref 78.0–100.0)
PLATELETS: 227 10*3/uL (ref 150–400)
RBC: 3.94 MIL/uL (ref 3.87–5.11)
RDW: 12.8 % (ref 11.5–15.5)
WBC: 5.5 10*3/uL (ref 4.0–10.5)

## 2015-07-29 LAB — BASIC METABOLIC PANEL
Anion gap: 9 (ref 5–15)
BUN: 9 mg/dL (ref 6–20)
CALCIUM: 9.3 mg/dL (ref 8.9–10.3)
CO2: 24 mmol/L (ref 22–32)
CREATININE: 0.8 mg/dL (ref 0.44–1.00)
Chloride: 106 mmol/L (ref 101–111)
GFR calc Af Amer: >60 mL/min (ref >60)
GFR calc non Af Amer: >60 mL/min (ref >60)
GLUCOSE: 109 mg/dL — AB (ref 65–99)
Potassium: 3.9 mmol/L (ref 3.5–5.1)
Sodium: 139 mmol/L (ref 135–145)

## 2015-07-29 LAB — I-STAT TROPONIN, ED: TROPONIN I, POC: 0 ng/mL (ref 0.00–0.08)

## 2015-07-29 NOTE — ED Notes (Signed)
Pt complains of chest tightness and shortness of breath for the past 2 days. Pt states she went to [facility] and was told she had a blood clot in her left lung. Pt states she feels worse. Pt states she also feels like caught the flu Saturday night. Pt states she has tried menthol patches on her chest and theraflu with no relief.

## 2015-07-29 NOTE — ED Provider Notes (Signed)
CSN: [id number]     Arrival date & time 07/29/15  2057 History   First MD Initiated Contact with Patient 07/29/15 2335     Chief Complaint  Patient presents with  . Chest Pain  . Shortness of Breath     (Consider location/radiation/quality/duration/timing/severity/associated sxs/prior Treatment) Patient is a 30 y.o. female presenting with chest pain and shortness of breath. The history is provided by the patient. No language interpreter was used.  Chest Pain Pain location:  L chest Pain quality: aching and pressure   Pain radiates to the back: no   Pain severity:  Mild Duration:  2 days Associated symptoms: cough and shortness of breath   Associated symptoms: no dysphagia, no fever, no nausea and not vomiting   Associated symptoms comment:  Presents with mild left chest discomfort described as pressure for the past 2 days without known injury. She has developed symptoms since onset of congestion, rhinorrhea, nonproductive cough. No vomiting. She feels sinus pressure without headache. She has been taking over-the-counter medications without relief. Shortness of Breath Associated symptoms: cough   Associated symptoms: no fever and no vomiting     Past Medical History  Diagnosis Date  . Sickle cell trait (HCC)   . Herpes    History reviewed. No pertinent past surgical history. Family History  Problem Relation Age of Onset  . Cancer Maternal Grandmother    Social History  Substance Use Topics  . Smoking status: Current Some Day Smoker -- 1.00 packs/day    Types: Cigarettes  . Smokeless tobacco: None     Comment: 1/2PPD  . Alcohol Use: 0.0 oz/week    0 Standard drinks or equivalent per week     Comment: pt states 8 to 10 shots nightly   OB History    No data available     Review of Systems  Constitutional: Negative for fever.  HENT: Positive for congestion, rhinorrhea and sinus pressure. Negative for trouble swallowing.   Respiratory: Positive for cough, chest  tightness and shortness of breath.   Gastrointestinal: Negative for nausea and vomiting.  Musculoskeletal: Negative for myalgias.      Allergies  Review of patient's allergies indicates no known allergies.  Home Medications   Prior to Admission medications   Medication Sig Start Date End Date Taking? Authorizing Provider  Chlorphen-Pseudoephed-APAP (THERAFLU FLU/COLD PO) Take 1 each by mouth every 6 (six) hours as needed (cold symptoms).   Yes Historical Provider, MD  naproxen (NAPROSYN) 500 MG tablet Take 1 tablet (500 mg total) by mouth 2 (two) times daily. Patient not taking: Reported on 07/29/2015 07/17/15   Glynn Octave, MD  valACYclovir (VALTREX) 500 MG tablet Take 1 tablet (500 mg total) by mouth 2 (two) times daily. Patient not taking: Reported on 07/29/2015 07/14/15   Ejiroghene E Mariea Clonts, MD   BP 110/74 mmHg  Pulse 95  Temp(Src) 98.4 F (36.9 C) (Oral)  Resp 18  SpO2 98%  LMP 07/03/2015 (Exact Date) Physical Exam  Constitutional: She is oriented to person, place, and time. She appears well-developed and well-nourished.  HENT:  Head: Normocephalic.  Right Ear: External ear normal.  Left Ear: External ear normal.  Nose: Mucosal edema present. Right sinus exhibits frontal sinus tenderness. Left sinus exhibits frontal sinus tenderness.  Mouth/Throat: Oropharynx is clear and moist.  Neck: Normal range of motion. Neck supple.  Cardiovascular: Normal rate and normal heart sounds.   No murmur heard. Pulmonary/Chest: Effort normal and breath sounds normal. She has no wheezes. She has no  rales. She exhibits tenderness.  Mild left chest tenderness.   Abdominal: Soft. Bowel sounds are normal. She exhibits no distension. There is no tenderness.  Musculoskeletal: Normal range of motion.  Lymphadenopathy:    She has no cervical adenopathy.  Neurological: She is oriented to person, place, and time.  Skin: Skin is warm and dry. No pallor.    ED Course  Procedures (including  critical care time) Labs Review Labs Reviewed  BASIC METABOLIC PANEL - Abnormal; Notable for the following:    Glucose, Bld 109 (*)    All other components within normal limits  CBC - Abnormal; Notable for the following:    HCT 35.8 (*)    All other components within normal limits  I-STAT TROPOININ, ED    Imaging Review Dg Chest 2 View  07/29/2015  CLINICAL DATA:  Cough and chest congestion for 1 week EXAM: CHEST  2 VIEW COMPARISON:  Chest radiograph July 16, 2015; chest CT July 17, 2015 FINDINGS: The lungs are clear. The heart size and pulmonary vascularity are normal. No adenopathy. No pneumothorax. No bone lesions. There is a prominent nipple shadow on the right. IMPRESSION: No edema or consolidation. Electronically Signed   By: Bretta Bang III M.D.   On: 07/29/2015 21:32   I have personally reviewed and evaluated these images and lab results as part of my medical decision-making.   EKG Interpretation None      MDM   Final diagnoses:  None    1. URI 2. Chest wall pain  The patient is well appearing with exam findings c/w viral URI and reproducible chest tenderness c/w muscular chest pain. VSS. Labs and imaging reassuring. She can be discharged home with recommendation for continued supportive care.     Elpidio Anis, PA-C 07/30/15 0003  Arby Barrette, MD 07/30/15 [phone]

## 2015-07-30 MED ORDER — IBUPROFEN 800 MG PO TABS: 800.0000 mg | ORAL_TABLET | Freq: Three times a day (TID) | ORAL | Status: DC

## 2015-07-30 MED ORDER — BENZONATATE 100 MG PO CAPS: 100.0000 mg | ORAL_CAPSULE | Freq: Three times a day (TID) | ORAL | Status: DC

## 2015-07-30 NOTE — Discharge Instructions (Signed)
Chest Wall Pain °Chest wall pain is pain in or around the bones and muscles of your chest. Sometimes, an injury causes this pain. Sometimes, the cause may not be known. This pain may take several weeks or longer to get better. °HOME CARE INSTRUCTIONS  °Pay attention to any changes in your symptoms. Take these actions to help with your pain:  °· Rest as told by your health care provider.   °· Avoid activities that cause pain. These include any activities that use your chest muscles or your abdominal and side muscles to lift heavy items.    °· If directed, apply ice to the painful area: °· Put ice in a plastic bag. °· Place a towel between your skin and the bag. °· Leave the ice on for 20 minutes, 2-3 times per day. °· Take over-the-counter and prescription medicines only as told by your health care provider. °· Do not use tobacco products, including cigarettes, chewing tobacco, and e-cigarettes. If you need help quitting, ask your health care provider. °· Keep all follow-up visits as told by your health care provider. This is important. °SEEK MEDICAL CARE IF: °· You have a fever. °· Your chest pain becomes worse. °· You have new symptoms. °SEEK IMMEDIATE MEDICAL CARE IF: °· You have nausea or vomiting. °· You feel sweaty or light-headed. °· You have a cough with phlegm (sputum) or you cough up blood. °· You develop shortness of breath. °  °This information is not intended to replace advice given to you by your health care provider. Make sure you discuss any questions you have with your health care provider. °  °Document Released: 05/17/2005 Document Revised: 02/05/2015 Document Reviewed: 08/12/2014 °Elsevier Interactive Patient Education ©2016 Elsevier Inc. ° °Upper Respiratory Infection, Adult °Most upper respiratory infections (URIs) are a viral infection of the air passages leading to the lungs. A URI affects the nose, throat, and upper air passages. The most common type of URI is nasopharyngitis and is typically  referred to as "the common cold." °URIs run their course and usually go away on their own. Most of the time, a URI does not require medical attention, but sometimes a bacterial infection in the upper airways can follow a viral infection. This is called a secondary infection. Sinus and middle ear infections are common types of secondary upper respiratory infections. °Bacterial pneumonia can also complicate a URI. A URI can worsen asthma and chronic obstructive pulmonary disease (COPD). Sometimes, these complications can require emergency medical care and may be life threatening.  °CAUSES °Almost all URIs are caused by viruses. A virus is a type of germ and can spread from one person to another.  °RISKS FACTORS °You may be at risk for a URI if:  °· You smoke.   °· You have chronic heart or lung disease. °· You have a weakened defense (immune) system.   °· You are very young or very old.   °· You have nasal allergies or asthma. °· You work in crowded or poorly ventilated areas. °· You work in health care facilities or schools. °SIGNS AND SYMPTOMS  °Symptoms typically develop 2-3 days after you come in contact with a cold virus. Most viral URIs last 7-10 days. However, viral URIs from the influenza virus (flu virus) can last 14-18 days and are typically more severe. Symptoms may include:  °· Runny or stuffy (congested) nose.   °· Sneezing.   °· Cough.   °· Sore throat.   °· Headache.   °· Fatigue.   °· Fever.   °· Loss of appetite.   °· Pain   in your forehead, behind your eyes, and over your cheekbones (sinus pain). °· Muscle aches.   °DIAGNOSIS  °Your health care provider may diagnose a URI by: °· Physical exam. °· Tests to check that your symptoms are not due to another condition such as: °¨ Strep throat. °¨ Sinusitis. °¨ Pneumonia. °¨ Asthma. °TREATMENT  °A URI goes away on its own with time. It cannot be cured with medicines, but medicines may be prescribed or recommended to relieve symptoms. Medicines may  help: °· Reduce your fever. °· Reduce your cough. °· Relieve nasal congestion. °HOME CARE INSTRUCTIONS  °· Take medicines only as directed by your health care provider.   °· Gargle warm saltwater or take cough drops to comfort your throat as directed by your health care provider. °· Use a warm mist humidifier or inhale steam from a shower to increase air moisture. This may make it easier to breathe. °· Drink enough fluid to keep your urine clear or pale yellow.   °· Eat soups and other clear broths and maintain good nutrition.   °· Rest as needed.   °· Return to work when your temperature has returned to normal or as your health care provider advises. You may need to stay home longer to avoid infecting others. You can also use a face mask and careful hand washing to prevent spread of the virus. °· Increase the usage of your inhaler if you have asthma.   °· Do not use any tobacco products, including cigarettes, chewing tobacco, or electronic cigarettes. If you need help quitting, ask your health care provider. °PREVENTION  °The best way to protect yourself from getting a cold is to practice good hygiene.  °· Avoid oral or hand contact with people with cold symptoms.   °· Wash your hands often if contact occurs.   °There is no clear evidence that vitamin C, vitamin E, echinacea, or exercise reduces the chance of developing a cold. However, it is always recommended to get plenty of rest, exercise, and practice good nutrition.  °SEEK MEDICAL CARE IF:  °· You are getting worse rather than better.   °· Your symptoms are not controlled by medicine.   °· You have chills. °· You have worsening shortness of breath. °· You have brown or red mucus. °· You have yellow or brown nasal discharge. °· You have pain in your face, especially when you bend forward. °· You have a fever. °· You have swollen neck glands. °· You have pain while swallowing. °· You have white areas in the back of your throat. °SEEK IMMEDIATE MEDICAL CARE IF:   °· You have severe or persistent: °¨ Headache. °¨ Ear pain. °¨ Sinus pain. °¨ Chest pain. °· You have chronic lung disease and any of the following: °¨ Wheezing. °¨ Prolonged cough. °¨ Coughing up blood. °¨ A change in your usual mucus. °· You have a stiff neck. °· You have changes in your: °¨ Vision. °¨ Hearing. °¨ Thinking. °¨ Mood. °MAKE SURE YOU:  °· Understand these instructions. °· Will watch your condition. °· Will get help right away if you are not doing well or get worse. °  °This information is not intended to replace advice given to you by your health care provider. Make sure you discuss any questions you have with your health care provider. °  °Document Released: 11/10/2000 Document Revised: 10/01/2014 Document Reviewed: 08/22/2013 °Elsevier Interactive Patient Education ©2016 Elsevier Inc. ° °

## 2015-07-31 ENCOUNTER — Ambulatory Visit (INDEPENDENT_AMBULATORY_CARE_PROVIDER_SITE_OTHER): Payer: Self-pay | Admitting: Internal Medicine

## 2015-07-31 ENCOUNTER — Encounter: Payer: Self-pay | Admitting: Internal Medicine

## 2015-07-31 VITALS — BP 116/68 | HR 74 | Temp 98.1°F | Wt 108.1 lb

## 2015-07-31 DIAGNOSIS — J069 Acute upper respiratory infection, unspecified: Secondary | ICD-10-CM

## 2015-07-31 MED ORDER — GUAIFENESIN-DM 100-10 MG/5ML PO SYRP: 5.0000 mL | ORAL_SOLUTION | ORAL | Status: DC | PRN

## 2015-07-31 MED ORDER — DIPHENHYDRAMINE HCL 25 MG PO CAPS: 25.0000 mg | ORAL_CAPSULE | Freq: Four times a day (QID) | ORAL | Status: DC | PRN

## 2015-07-31 NOTE — Progress Notes (Signed)
Patient ID: Teresa Burns, female   DOB: Jul 03, 1985, 30 y.o.   MRN: [id number]   Subjective:   Patient ID: Teresa Burns female   DOB: 09/17/1985 30 y.o.   MRN: [id number]  HPI: Teresa Burns is a 30 y.o. with PMH listed below, presented for Ed follow up for Cough. Was seen in the ED- 07/29/15 for cough. Persistent today, dry, interfering with sleep. Initial Sorethroat, with some rhinorrhea, no dyspnea, no fever. Hx of sick contacts. She feels achy also. Cough present for ~4 days now. She was given benzonatate in the ed without significant relief.   Past Medical History  Diagnosis Date  . Sickle cell trait (HCC)   . Herpes    Review of Systems: CONSTITUTIONAL- No Fever, no weightloss, good appetite SKIN- No Rash, colour changes or itching. HEAD- No Headache or dizziness. Mouth/throat- resolved Sorethroat,  CARDIAC- No Palpitations, or chest pain. GI- No nausea, vomiting, diarrhoea, constipation, abd pain. URINARY- No Frequency or dysuria.  Objective:  Physical Exam: Filed Vitals:   07/31/15 1045  BP: 116/68  Pulse: 74  Temp: 98.1 F (36.7 C)  TempSrc: Oral  Weight: 108 lb 1.6 oz (49.034 kg)  SpO2: 100%   GENERAL- alert, co-operative, appears as stated age, coughing- sounds dry HEENT- Atraumatic, normocephalic, PERRL, oral mucosa appears moist, no pharyngeal erythema or exudates CARDIAC- RRR, no murmurs, rubs or gallops. RESP- Moving equal volumes of air, and clear to auscultation bilaterally, no wheezes or crackles. ABDOMEN- Soft, non tender NEURO- Alert and oriented Gait- Normal. EXTREMITIES- warm, no pedal edema. SKIN- Warm, dry, No rash or lesion. PSYCH- Normal mood and affect, appropriate thought content and speech.  Assessment & Plan:   The patient's case and plan of care was discussed with attending physician, Dr. Cyndie Chime. See problem based charting.

## 2015-07-31 NOTE — Assessment & Plan Note (Signed)
URTI- with cough, most likely viral. Sick contacts, with initial sorethroat and rhinorrhea. No fever or SOB. Will give supportive therapy. - Guaifenasin- dexthromethophan- Q4H - Diphenhydramine- , Q6H, but encouraged use at night for sleep - She will come in 2 months for her PAP smear, she is on her period today.

## 2015-08-01 NOTE — Progress Notes (Signed)
Medicine attending: Medical history, presenting problems, physical findings, and medications, reviewed with resident physician Dr Ejiro Emokpae on the day of the patient visit and I concur with her evaluation and management plan. 

## 2015-10-14 ENCOUNTER — Encounter: Payer: Self-pay | Admitting: Internal Medicine

## 2015-10-14 ENCOUNTER — Other Ambulatory Visit: Payer: Self-pay | Admitting: *Deleted

## 2015-10-14 DIAGNOSIS — A6000 Herpesviral infection of urogenital system, unspecified: Secondary | ICD-10-CM

## 2015-10-14 MED ORDER — VALACYCLOVIR HCL 500 MG PO TABS: 500.0000 mg | ORAL_TABLET | Freq: Two times a day (BID) | ORAL | Status: DC

## 2015-10-14 NOTE — Telephone Encounter (Signed)
Will start episodic therapy.  She can discuss the need for chronic suppressive therapy if appropriate and recurrences are frequent with her PCP at the follow-up visit.

## 2015-10-14 NOTE — Telephone Encounter (Signed)
Pt states she now has medicaid and wants a full script this time since she does not have to pay out of pocket Pt is out of med and outbreak has started in the last 24 hrs, needs this filled asap

## 2015-10-21 ENCOUNTER — Encounter: Payer: Self-pay | Admitting: Internal Medicine

## 2015-10-21 ENCOUNTER — Ambulatory Visit (INDEPENDENT_AMBULATORY_CARE_PROVIDER_SITE_OTHER): Payer: Medicaid Other | Admitting: Internal Medicine

## 2015-10-21 VITALS — BP 107/61 | HR 64 | Temp 98.2°F | Ht 61.0 in | Wt 116.4 lb

## 2015-10-21 DIAGNOSIS — Z30011 Encounter for initial prescription of contraceptive pills: Secondary | ICD-10-CM | POA: Diagnosis not present

## 2015-10-21 DIAGNOSIS — Z3009 Encounter for other general counseling and advice on contraception: Secondary | ICD-10-CM | POA: Insufficient documentation

## 2015-10-21 DIAGNOSIS — Z8742 Personal history of other diseases of the female genital tract: Secondary | ICD-10-CM | POA: Diagnosis not present

## 2015-10-21 DIAGNOSIS — A6000 Herpesviral infection of urogenital system, unspecified: Secondary | ICD-10-CM

## 2015-10-21 LAB — POCT URINE PREGNANCY: PREG TEST UR: NEGATIVE

## 2015-10-21 MED ORDER — NORGESTIMATE-ETH ESTRADIOL 0.25-35 MG-MCG PO TABS: 1.0000 | ORAL_TABLET | Freq: Every day | ORAL | Status: DC

## 2015-10-21 NOTE — Patient Instructions (Signed)
Please come back for your pap smear and let the front desk know it is for a PAP smear.  We will send in the prescription for the Pills when we get the result.

## 2015-10-21 NOTE — Assessment & Plan Note (Signed)
Pt will like to start OCPs for severe menstrual cramps, and menorrghia, chronic. Hgb stable at- 12.4-  07/2015. No personal of family hx of fibroids. She has never taken OCPs. She has no personal of family hx of thromboembolic dx, she has no migraines, she smokes cigarretes, she has no cardiac hx, no masses. Last menstrual cycle- 10/03/2015. Last sexual activity >2 months ago.   Plan- Preg test- negative, pt left before results were back - Will start Sprintec daily - Call pt with instructions, taking it daily, no missing pills, withdrawal bleed, side effects- bloating, spotting - Pt says she has cut down on number of cigs, encouraged complete cessation.

## 2015-10-21 NOTE — Assessment & Plan Note (Addendum)
No lesions today. She has 3-4 episodes per year. Mostly when she is under stress. She takes Valacyclovir intermittently when she has these outbreaks, she tolerates this well. She does not qualify for chronic suppressive therapy. I have not seen these lesions. She is new to our clinic.   I have encouraged her to try to come in to clinic when she has a new outbreak so we can be sure of the diagnosis. She voiced understanding.

## 2015-10-21 NOTE — Progress Notes (Signed)
Patient ID: Teresa Burns, female   DOB: 08/09/1985, 30 y.o.   MRN: [id number]   Subjective:   Patient ID: Teresa Burns female   DOB: 06/05/1985 30 y.o.   MRN: [id number]  HPI: Teresa Burns is a 30 y.o. with PMH of herpes genitalis. She was supposed to get a PAP smear done today, but she has to go to work. She will also like to start taking an Oral contraceptive pill, for Severe menstrual cramps and chronic heavy periods.  Past Medical History  Diagnosis Date  . Sickle cell trait (HCC)   . Herpes    Review of Systems: Complete ROS Could not be done as pt had to leave in a hurry. Cardic- No chest pain Extremities- No swelling Neuro- No migraines  Objective:  Physical Exam: Filed Vitals:   10/21/15 1518  BP: 107/61  Pulse: 64  Temp: 98.2 F (36.8 C)  TempSrc: Oral  Height: 5\' 1"  (1.549 m)  Weight: 116 lb 6.4 oz (52.799 kg)  SpO2: 100%   GENERAL- alert, co-operative, appears as stated age, not in any distress. HEENT- Atraumatic, normocephalic CARDIAC- RRR, no murmurs, rubs or gallops. RESP- Moving equal volumes of air, no wheezes or crackles. ABDOMEN- Soft, nontender, liver and spleen not palpable. NEURO- Alert, oriented, Gait- Normal. EXTREMITIES- pulse 2+, symmetric, no pedal edema. SKIN- Warm, dry, No rash or lesion. PSYCH- Normal mood and affect, appropriate thought content and speech.  Assessment & Plan:   The patient's case and plan of care was discussed with attending physician, Dr. Para Marchuncan.  Please see problem based charting for assessment and plan.

## 2015-10-22 MED ORDER — NORGESTIMATE-ETH ESTRADIOL 0.25-35 MG-MCG PO TABS: 1.0000 | ORAL_TABLET | Freq: Every day | ORAL | Status: DC

## 2015-10-22 MED ORDER — VALACYCLOVIR HCL 500 MG PO TABS: 500.0000 mg | ORAL_TABLET | Freq: Two times a day (BID) | ORAL | Status: DC

## 2015-10-22 NOTE — Progress Notes (Signed)
Internal Medicine Clinic Attending  Case discussed with Dr. Emokpae at the time of the visit.  We reviewed the resident's history and exam and pertinent patient test results.  I agree with the assessment, diagnosis, and plan of care documented in the resident's note.  

## 2015-10-22 NOTE — Addendum Note (Signed)
Addended by: Onnie BoerEMOKPAE, Johnathin Vanderschaaf E on: 10/22/2015 11:57 AM   Modules accepted: Orders

## 2015-10-24 ENCOUNTER — Telehealth: Payer: Self-pay | Admitting: Internal Medicine

## 2015-10-24 NOTE — Telephone Encounter (Signed)
APT. REMINDER CALL, LMTCB °

## 2015-10-28 ENCOUNTER — Encounter: Payer: Self-pay | Admitting: Internal Medicine

## 2015-10-28 ENCOUNTER — Encounter: Payer: Medicaid Other | Admitting: Internal Medicine

## 2015-11-14 ENCOUNTER — Encounter: Payer: Self-pay | Admitting: *Deleted

## 2016-03-30 ENCOUNTER — Other Ambulatory Visit: Payer: Self-pay

## 2016-03-30 MED ORDER — VALACYCLOVIR HCL 500 MG PO TABS: 500.0000 mg | ORAL_TABLET | Freq: Two times a day (BID) | ORAL | 1 refills | Status: DC

## 2016-03-30 NOTE — Telephone Encounter (Signed)
Requesting Valtrex to be filled.

## 2016-03-31 ENCOUNTER — Telehealth: Payer: Self-pay | Admitting: Internal Medicine

## 2016-03-31 NOTE — Telephone Encounter (Signed)
PT. REMINDER CALL, LMTCB °

## 2016-04-01 ENCOUNTER — Encounter: Payer: Medicaid Other | Admitting: Internal Medicine

## 2016-06-10 ENCOUNTER — Other Ambulatory Visit: Payer: Self-pay

## 2016-06-10 NOTE — Telephone Encounter (Signed)
valACYclovir (VALTREX) 500 MG tablet, [facility] on [address]

## 2016-06-11 NOTE — Telephone Encounter (Signed)
Patient has had a break out and is still waiting for her refill and a call back today if possible.

## 2016-06-12 MED ORDER — VALACYCLOVIR HCL 500 MG PO TABS: 500.0000 mg | ORAL_TABLET | Freq: Two times a day (BID) | ORAL | 1 refills | Status: DC

## 2016-06-15 ENCOUNTER — Ambulatory Visit: Payer: Medicaid Other

## 2016-06-15 NOTE — Telephone Encounter (Signed)
Charsetta, doris, This pt needs to be seen, has not been seen since may, could we make her a pcp appt

## 2016-06-18 ENCOUNTER — Encounter: Payer: Self-pay | Admitting: Internal Medicine

## 2016-06-18 NOTE — Telephone Encounter (Signed)
Pt has no showed last three appointments.  [facility] B[facility]Butcher for review.

## 2016-06-18 NOTE — Telephone Encounter (Signed)
I have letter to send

## 2016-06-25 ENCOUNTER — Telehealth: Payer: Self-pay | Admitting: Internal Medicine

## 2016-06-25 NOTE — Telephone Encounter (Signed)
Perfect,  Thank you for the clarification.

## 2016-06-25 NOTE — Telephone Encounter (Signed)
Patient received and signed for certified letter on 06-21-16 instructing to call for an appointment in the next 30 days.  Forwarding to Dr. Rogelia BogaButcher and PCP.

## 2016-06-25 NOTE — Telephone Encounter (Signed)
Sorry,  I am a bit confused. What is this letter that she signed for or what do we need to do for her?  Thanks, [name]

## 2016-06-25 NOTE — Telephone Encounter (Signed)
Freq no shows. Only seen once May 2017. Gets valtrex. Needs to be seen once yrly to cont to receive meds. Letter stated she just be seen before May or would be dismissed as pt. Only thing you ned to do is not cont to give refills if not seen by MAy

## 2016-09-10 ENCOUNTER — Other Ambulatory Visit: Payer: Self-pay | Admitting: Internal Medicine

## 2016-09-10 ENCOUNTER — Other Ambulatory Visit: Payer: Self-pay

## 2016-09-10 DIAGNOSIS — A6004 Herpesviral vulvovaginitis: Secondary | ICD-10-CM

## 2016-09-10 MED ORDER — VALACYCLOVIR HCL 500 MG PO TABS: 500.0000 mg | ORAL_TABLET | Freq: Two times a day (BID) | ORAL | 1 refills | Status: AC

## 2016-09-10 NOTE — Telephone Encounter (Signed)
valACYclovir (VALTREX) 500 MG tablet [facility] [address]

## 2016-09-10 NOTE — Telephone Encounter (Signed)
Patient called reports she is having a sever outbreak and needs medication ASAP trying to avoid ER visit

## 2016-09-13 NOTE — Telephone Encounter (Signed)
This was done 4/13

## 2016-10-21 ENCOUNTER — Encounter: Payer: Self-pay | Admitting: Internal Medicine

## 2016-10-21 ENCOUNTER — Encounter: Payer: Medicaid Other | Admitting: Internal Medicine

## 2016-10-22 ENCOUNTER — Encounter: Payer: Self-pay | Admitting: Internal Medicine

## 2016-12-31 ENCOUNTER — Encounter (HOSPITAL_COMMUNITY): Payer: Self-pay | Admitting: *Deleted

## 2016-12-31 ENCOUNTER — Emergency Department (HOSPITAL_COMMUNITY)
Admission: EM | Admit: 2016-12-31 | Discharge: 2017-01-01 | Disposition: A | Discharge status: Home / Self Care | Payer: Medicaid Other | Attending: Emergency Medicine | Admitting: Emergency Medicine

## 2016-12-31 DIAGNOSIS — K0889 Other specified disorders of teeth and supporting structures: Secondary | ICD-10-CM | POA: Diagnosis present

## 2016-12-31 DIAGNOSIS — Z79899 Other long term (current) drug therapy: Secondary | ICD-10-CM | POA: Diagnosis not present

## 2016-12-31 DIAGNOSIS — F17210 Nicotine dependence, cigarettes, uncomplicated: Secondary | ICD-10-CM | POA: Diagnosis not present

## 2016-12-31 NOTE — ED Triage Notes (Signed)
The pt is c/o a toothache she has had this same toothache for 2 years  Worse tonight  lmp 2 weeks ago

## 2017-01-01 MED ORDER — HYDROCODONE-ACETAMINOPHEN 5-325 MG PO TABS
1.0000 | ORAL_TABLET | Freq: Once | ORAL | Status: AC
  Administered 2017-01-01: 1 via ORAL
  Filled 2017-01-01: qty 1

## 2017-01-01 MED ORDER — HYDROCODONE-ACETAMINOPHEN 5-325 MG PO TABS: 1.0000 | ORAL_TABLET | Freq: Four times a day (QID) | ORAL | 0 refills | Status: DC | PRN

## 2017-01-01 NOTE — Discharge Instructions (Signed)
Please read instructions below. You can take Norco every 6 hours as needed for severe pain. Do not drive, take Tylenol or drink alcohol while taking this medication. You can take Naproxen up to 2 times per day OR advil every 6 hours with meals, as needed for pain. Schedule an appointment with a dentist, using the dental resource guide attached. Return to the ER for difficulty swallowing or breathing, fever, or new or worsening symptoms.

## 2017-01-01 NOTE — ED Notes (Signed)
Pt verbalized understanding discharge instructions and denies any further needs or questions at this time. VS stable, ambulatory and steady gait.   

## 2017-01-01 NOTE — ED Provider Notes (Signed)
[facility]-EMERGENCY DEPT Provider Note   CSN: [id number] Arrival date & time: 12/31/16  2339     History   Chief Complaint Chief Complaint  Patient presents with  . Dental Pain    HPI Teresa Burns is a 31 y.o. female presenting with worsening left upper dental pain since this evening. Patient states she has had a cavity in that tooth for the same period of time, however tonight she put a putty, an "artificial crown," into the hole this evening and began having worsening pain. She denies fever, difficulty swallowing or breathing, purulent drainage, or anything other symptoms today.  The history is provided by the patient.    Past Medical History:  Diagnosis Date  . Herpes   . Sickle cell trait [facility](HCC)     Patient Active Problem List   Diagnosis Date Noted  . Encounter for general counseling on prescription of oral contraceptives 10/21/2015  . Pleuritic chest pain 07/15/2015  . Herpes genitalis 07/15/2015    History reviewed. No pertinent surgical history.  OB History    No data available       Home Medications    Prior to Admission medications   Medication Sig Start Date End Date Taking? Authorizing Provider  Chlorphen-Pseudoephed-APAP (THERAFLU FLU/COLD PO) Take 1 each by mouth every 6 (six) hours as needed (cold symptoms).    [provider]  diphenhydrAMINE (BENADRYL) 25 mg capsule Take 1 capsule (25 mg total) by mouth every 6 (six) hours as needed. 07/31/15   Emokpae, Ejiroghene E, MD  guaiFENesin-dextromethorphan (ROBITUSSIN DM) 100-10 MG/5ML syrup Take 5 mLs by mouth every 4 (four) hours as needed for cough. 07/31/15   Emokpae, Ejiroghene E, MD  HYDROcodone-acetaminophen (NORCO/VICODIN) 5-325 MG tablet Take 1-2 tablets by mouth every 6 (six) hours as needed for severe pain. 01/01/17   Russo, [place] N, PA-C  ibuprofen (ADVIL,MOTRIN) 800 MG tablet Take 1 tablet (800 mg total) by mouth 3 (three) times daily. 07/30/15   Elpidio AnisUpstill, Shari, PA-C  naproxen (NAPROSYN) 500 MG  tablet Take 1 tablet (500 mg total) by mouth 2 (two) times daily. Patient not taking: Reported on 07/29/2015 07/17/15   Glynn Octaveancour, Stephen, MD  norgestimate-ethinyl estradiol (ORTHO-CYCLEN,SPRINTEC,PREVIFEM) 0.25-35 MG-MCG tablet Take 1 tablet by mouth daily. 10/22/15   Onnie BoerEmokpae, Ejiroghene E, MD    Family History Family History  Problem Relation Age of Onset  . Cancer Maternal Grandmother     Social History Social History  Substance Use Topics  . Smoking status: Current Some Day Smoker    Packs/day: 0.50    Types: Cigarettes  . Smokeless tobacco: Never Used     Comment: 1/2PPD  . Alcohol use 0.0 oz/week     Comment: pt states 8 to 10 shots nightly     Allergies   Patient has no known allergies.   Review of Systems Review of Systems  Constitutional: Negative for chills and fever.  HENT: Positive for dental problem. Negative for facial swelling, sore throat, trouble swallowing and voice change.   Gastrointestinal: Negative for nausea and vomiting.     Physical Exam Updated Vital Signs BP 122/80 (BP Location: Left Arm)   Pulse 90   Temp 98.9 F (37.2 C) (Oral)   Resp (!) 22   Ht 5' (1.524 m)   Wt 50.8 kg (112 lb)   LMP 12/17/2016   SpO2 100%   BMI 21.87 kg/m   Physical Exam  Constitutional: She appears well-developed and well-nourished.  Patient tolerating secretions  HENT:  Head: Normocephalic  and atraumatic.  Mouth/Throat: Uvula is midline and oropharynx is clear and moist. No trismus in the jaw. No uvula swelling.    Tooth 26 with white putty in what appears to be a dental carie. Gingival erythema and edema surrounding tooth. No fluctuant abscess. Tooth is TTP, and not loose.   Eyes: Conjunctivae are normal.  Neck: Normal range of motion. Neck supple. No tracheal deviation present.  Cardiovascular: Normal rate, regular rhythm, normal heart sounds and intact distal pulses.   Pulmonary/Chest: Effort normal and breath sounds normal. No stridor.  Lymphadenopathy:     She has no cervical adenopathy.  Psychiatric: She has a normal mood and affect. Her behavior is normal.  Nursing note and vitals reviewed.    ED Treatments / Results  Labs (all labs ordered are listed, but only abnormal results are displayed) Labs Reviewed - No data to display  EKG  EKG Interpretation None       Radiology No results found.  Procedures Procedures (including critical care time)  Medications Ordered in ED Medications  HYDROcodone-acetaminophen (NORCO/VICODIN) 5-325 MG per tablet 1 tablet (1 tablet Oral Given 01/01/17 0042)     Initial Impression / Assessment and Plan / ED Course  I have reviewed the triage vital signs and the nursing notes.  Pertinent labs & imaging results that were available during my care of the patient were reviewed by me and considered in my medical decision making (see chart for details).     Patient with dentalgia likely due to dental caries. Pt put dental putty into dental carie, likely causing the increased pain. Putty difficult to remove and was unable to as it has hardened. No gross abscess. VSS, afebrile, tolerating secretions. Exam unconcerning for Ludwig's angina or spread of infection. Will treat with pain medicine. Urged patient to follow-up with dentist in morning. Pt safe for discharge.  Discussed results, findings, treatment and follow up. Patient advised of return precautions. Patient verbalized understanding and agreed with plan.  Final Clinical Impressions(s) / ED Diagnoses   Final diagnoses:  Dentalgia    New Prescriptions New Prescriptions   HYDROCODONE-ACETAMINOPHEN (NORCO/VICODIN) 5-325 MG TABLET    Take 1-2 tablets by mouth every 6 (six) hours as needed for severe pain.     Russo, [place] N, PA-C 01/01/17 Margarito Courser0123    Zadie RhineWickline, Donald, MD 01/01/17 [phone]

## 2017-05-29 ENCOUNTER — Other Ambulatory Visit: Payer: Self-pay

## 2017-05-29 ENCOUNTER — Emergency Department (HOSPITAL_COMMUNITY)
Admission: EM | Admit: 2017-05-29 | Discharge: 2017-05-29 | Disposition: A | Discharge status: Home / Self Care | Payer: Medicaid Other | Attending: Emergency Medicine | Admitting: Emergency Medicine

## 2017-05-29 ENCOUNTER — Encounter (HOSPITAL_COMMUNITY): Payer: Self-pay | Admitting: Emergency Medicine

## 2017-05-29 DIAGNOSIS — F17210 Nicotine dependence, cigarettes, uncomplicated: Secondary | ICD-10-CM | POA: Insufficient documentation

## 2017-05-29 DIAGNOSIS — Z79899 Other long term (current) drug therapy: Secondary | ICD-10-CM | POA: Insufficient documentation

## 2017-05-29 DIAGNOSIS — R519 Headache, unspecified: Secondary | ICD-10-CM

## 2017-05-29 DIAGNOSIS — R51 Headache: Secondary | ICD-10-CM | POA: Insufficient documentation

## 2017-05-29 MED ORDER — KETOROLAC TROMETHAMINE 30 MG/ML IJ SOLN
30.0000 mg | Freq: Once | INTRAMUSCULAR | Status: AC
  Administered 2017-05-29: 30 mg via INTRAVENOUS
  Filled 2017-05-29: qty 1

## 2017-05-29 MED ORDER — METOCLOPRAMIDE HCL 5 MG/ML IJ SOLN
10.0000 mg | Freq: Once | INTRAMUSCULAR | Status: AC
  Administered 2017-05-29: 10 mg via INTRAVENOUS
  Filled 2017-05-29: qty 2

## 2017-05-29 MED ORDER — SODIUM CHLORIDE 0.9 % IV BOLUS (SEPSIS)
1000.0000 mL | Freq: Once | INTRAVENOUS | Status: AC
  Administered 2017-05-29: 1000 mL via INTRAVENOUS

## 2017-05-29 MED ORDER — DIPHENHYDRAMINE HCL 50 MG/ML IJ SOLN
25.0000 mg | Freq: Once | INTRAMUSCULAR | Status: AC
  Administered 2017-05-29: 25 mg via INTRAVENOUS
  Filled 2017-05-29: qty 1

## 2017-05-29 NOTE — ED Provider Notes (Signed)
[facility]ENC[facility] Provider Note   CSN: [id number] Arrival date & time: 05/29/17  1600     History   Chief Complaint Chief Complaint  Patient presents with  . Headache    HPI Teresa Burns is a 31 y.o. female.  HPI  Patient presenting with throbbing generalized frontal headache.  She states the headache began 2 days ago.  She states she has had similar headaches in the past that seem to occur more frequently as she gets older.  She states her mother had a significant history of migraine headaches.  She has light sensitivity and photosensitivity.  She has some nausea but no vomiting.  She has tried numerous over-the-counter medications Excedrin.  She denies any neck pain or significant sore throat.  She did have a low-grade fever a couple of days ago but none since. There are no other associated systemic symptoms, there are no other alleviating or modifying factors.   Past Medical History:  Diagnosis Date  . Herpes   . Sickle cell trait [facility](HCC)     Patient Active Problem List   Diagnosis Date Noted  . Encounter for general counseling on prescription of oral contraceptives 10/21/2015  . Pleuritic chest pain 07/15/2015  . Herpes genitalis 07/15/2015    History reviewed. No pertinent surgical history.  OB History    No data available       Home Medications    Prior to Admission medications   Medication Sig Start Date End Date Taking? Authorizing Provider  acetaminophen (TYLENOL) 500 MG tablet Take 1,000 mg by mouth every 6 (six) hours as needed for headache.   Yes [provider]  aspirin-acetaminophen-caffeine (EXCEDRIN MIGRAINE) [phone] MG tablet Take 2 tablets by mouth every 6 (six) hours as needed for headache.   Yes [provider]  Aspirin-Caffeine (BC FAST PAIN RELIEF PO) Take 1 packet by mouth every 8 (eight) hours as needed (headache).   Yes [provider]  norgestimate-ethinyl estradiol  (ORTHO-CYCLEN,SPRINTEC,PREVIFEM) 0.25-35 MG-MCG tablet Take 1 tablet by mouth daily. Patient not taking: Reported on 05/29/2017 10/22/15   Onnie BoerEmokpae, Ejiroghene E, MD    Family History Family History  Problem Relation Age of Onset  . Cancer Maternal Grandmother     Social History Social History   Tobacco Use  . Smoking status: Current Some Day Smoker    Packs/day: 0.50    Types: Cigarettes  . Smokeless tobacco: Never Used  . Tobacco comment: 1/2PPD  Substance Use Topics  . Alcohol use: Yes    Alcohol/week: 0.0 oz    Comment: pt states 8 to 10 shots nightly  . Drug use: Yes    Types: Marijuana     Allergies   Patient has no known allergies.   Review of Systems Review of Systems  ROS reviewed and all otherwise negative except for mentioned in HPI   Physical Exam Updated Vital Signs BP 105/66   Pulse 64   Temp 98.4 F (36.9 C) (Oral)   Resp 12   Ht 5\' 1"  (1.549 m)   Wt 49.9 kg (110 lb)   LMP 05/22/2017   SpO2 99%   BMI 20.78 kg/m  Vitals reviewed Physical Exam  Physical Examination: General appearance - alert, well appearing, and in no distress Mental status - alert, oriented to person, place, and time Eyes - PERRL, EOMI Chest - clear to auscultation, no wheezes, rales or rhonchi, symmetric air entry Heart - normal rate, regular rhythm, normal S1, S2, no murmurs,  rubs, clicks or gallops Neurological - alert, oriented, normal speech, no focal findings or movement disorder noted Extremities - peripheral pulses normal, no pedal edema, no clubbing or cyanosis Skin - normal coloration and turgor, no rashes   ED Treatments / Results  Labs (all labs ordered are listed, but only abnormal results are displayed) Labs Reviewed - No data to display  EKG  EKG Interpretation None       Radiology No results found.  Procedures Procedures (including critical care time)  Medications Ordered in ED Medications  ketorolac (TORADOL) 30 MG/ML injection 30 mg (30  mg Intravenous Given 05/29/17 1811)  diphenhydrAMINE (BENADRYL) injection 25 mg (25 mg Intravenous Given 05/29/17 1811)  metoCLOPramide (REGLAN) injection 10 mg (10 mg Intravenous Given 05/29/17 1811)  sodium chloride 0.9 % bolus 1,000 mL (0 mLs Intravenous Stopped 05/29/17 2041)     Initial Impression / Assessment and Plan / ED Course  I have reviewed the triage vital signs and the nursing notes.  Pertinent labs & imaging results that were available during my care of the patient were reviewed by me and considered in my medical decision making (see chart for details).    Headache which is most consistent with a migraine type headache.  Her neurologic exam is normal.  She has no fever or nuchal rigidity to suggest meningitis.  She has no weakness or changes in vision or speech.  Doubt subarachnoid hemorrhage.  She has a normal neurologic exam.  Her headache was relieved after migraine cocktail and IV fluids. Discharged with strict return precautions.  Pt agreeable with plan.    Final Clinical Impressions(s) / ED Diagnoses   Final diagnoses:  Bad headache    ED Discharge Orders    None       Sipriano Fendley, Latanya MaudlinMartha L, MD 05/29/17 2317

## 2017-05-29 NOTE — Discharge Instructions (Signed)
Return to the ED with any concerns including changes in vision or speech, weakness of arms or legs, fainting, decreased level of alertness/lethargy, or any other alarming symptoms °

## 2017-05-29 NOTE — ED Triage Notes (Signed)
C/o L temporal headache x 4 days with nausea, vomiting, and light sensitivity.  States she had a fever up to 101 over the last few days but no fever today.  Tried taking BC Powder, Excedrin Migraine, Excedrin tension, ibuprofen, Aspirin, and Tylenol without relief.

## 2017-06-10 ENCOUNTER — Ambulatory Visit (INDEPENDENT_AMBULATORY_CARE_PROVIDER_SITE_OTHER): Payer: Medicaid Other

## 2017-06-10 ENCOUNTER — Ambulatory Visit (HOSPITAL_COMMUNITY)
Admission: EM | Admit: 2017-06-10 | Discharge: 2017-06-10 | Disposition: A | Discharge status: Home / Self Care | Payer: Medicaid Other | Attending: Internal Medicine | Admitting: Internal Medicine

## 2017-06-10 ENCOUNTER — Encounter (HOSPITAL_COMMUNITY): Payer: Self-pay | Admitting: Family Medicine

## 2017-06-10 DIAGNOSIS — M549 Dorsalgia, unspecified: Secondary | ICD-10-CM | POA: Diagnosis not present

## 2017-06-10 DIAGNOSIS — M546 Pain in thoracic spine: Secondary | ICD-10-CM

## 2017-06-10 DIAGNOSIS — Z760 Encounter for issue of repeat prescription: Secondary | ICD-10-CM

## 2017-06-10 DIAGNOSIS — Z8619 Personal history of other infectious and parasitic diseases: Secondary | ICD-10-CM

## 2017-06-10 MED ORDER — VALACYCLOVIR HCL 500 MG PO TABS: 500.0000 mg | ORAL_TABLET | Freq: Two times a day (BID) | ORAL | 0 refills | Status: AC

## 2017-06-10 MED ORDER — NAPROXEN 375 MG PO TABS: 375.0000 mg | ORAL_TABLET | Freq: Two times a day (BID) | ORAL | 0 refills | Status: DC

## 2017-06-10 NOTE — ED Triage Notes (Signed)
Pt sts that she is having back pain and hard to take a deep breath. sts this started after she had somebody pop her back last night. sts also outbreak of herpes and needs valtrex.

## 2017-06-10 NOTE — ED Provider Notes (Signed)
[facility]    CSN: [id number] Arrival date & time: 06/10/17  1229     History   Chief Complaint Chief Complaint  Patient presents with  . Back Pain    HPI Teresa Burns is a 32 y.o. female.   Teresa Burns presents with complaints of right upper back pain near scapula which started last night. States it started after she had someone "pop" her back for her. 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 Without cough, shortness of breath, fevers, chest pain, recent travel, oral birth control use, leg pain or swelling. Rates pain 6/10. Feels worse with deep breathing or certain movements. Has not taken any medications for her symptoms. States has a history of chest pain but this is different. Also states she needs refill of valtrex for genital herpes outbreak.   ROS per HPI.       Past Medical History:  Diagnosis Date  . Herpes   . Sickle cell trait [facility])     Patient Active Problem List   Diagnosis Date Noted  . Encounter for general counseling on prescription of oral contraceptives 10/21/2015  . Pleuritic chest pain 07/15/2015  . Herpes genitalis 07/15/2015    History reviewed. No pertinent surgical history.  OB History    No data available       Home Medications    Prior to Admission medications   Medication Sig Start Date End Date Taking? Authorizing Provider  acetaminophen (TYLENOL) 500 MG tablet Take 1,000 mg by mouth every 6 (six) hours as needed for headache.    [provider]  aspirin-acetaminophen-caffeine (EXCEDRIN MIGRAINE) [phone] MG tablet Take 2 tablets by mouth every 6 (six) hours as needed for headache.    [provider]  Aspirin-Caffeine (BC FAST PAIN RELIEF PO) Take 1 packet by mouth every 8 (eight) hours as needed (headache).    [provider]  naproxen (NAPROSYN) 375 MG tablet Take 1 tablet (375 mg total) by mouth 2 (two) times daily. 06/10/17   Georgetta Haber, NP  valACYclovir  (VALTREX) 500 MG tablet Take 1 tablet (500 mg total) by mouth 2 (two) times daily for 3 days. 06/10/17 06/13/17  Georgetta Haber, NP    Family History Family History  Problem Relation Age of Onset  . Cancer Maternal Grandmother     Social History Social History   Tobacco Use  . Smoking status: Current Some Day Smoker    Packs/day: 0.50    Types: Cigarettes  . Smokeless tobacco: Never Used  . Tobacco comment: 1/2PPD  Substance Use Topics  . Alcohol use: Yes    Alcohol/week: 0.0 oz    Comment: pt states 8 to 10 shots nightly  . Drug use: Yes    Types: Marijuana     Allergies   Patient has no known allergies.   Review of Systems Review of Systems   Physical Exam Triage Vital Signs ED Triage Vitals [06/10/17 1251]  Enc Vitals Group     BP 113/78     Pulse Rate 93     Resp 18     Temp 98.3 F (36.8 C)     Temp src      SpO2 98 %     Weight      Height      Head Circumference      Peak Flow      Pain Score 7     Pain Loc  Pain Edu?      Excl. in GC?    No data found.  Updated Vital Signs BP 113/78   Pulse 93   Temp 98.3 F (36.8 C)   Resp 18   LMP 05/22/2017   SpO2 98%   Visual Acuity Right Eye Distance:   Left Eye Distance:   Bilateral Distance:    Right Eye Near:   Left Eye Near:    Bilateral Near:     Physical Exam  Constitutional: She is oriented to person, place, and time. She appears well-developed and well-nourished. No distress.  Cardiovascular: Normal rate, regular rhythm and normal heart sounds.  Pulmonary/Chest: Effort normal and breath sounds normal. She exhibits no tenderness.  Musculoskeletal:       Thoracic back: She exhibits tenderness. She exhibits normal range of motion, no bony tenderness, no swelling, no edema, no deformity, no laceration and normal pulse.       Back:  Distal trapezius with tenderness on exam; complete ROM with back and shoulders; with across chest adduction patient can feel a "stretchign" to area of  pain; without bony or spinous process tenderness; without chest wall tenderness  Neurological: She is alert and oriented to person, place, and time.  Skin: Skin is warm and dry.     UC Treatments / Results  Labs (all labs ordered are listed, but only abnormal results are displayed) Labs Reviewed - No data to display  EKG  EKG Interpretation None       Radiology Dg Chest 2 View  Result Date: 06/10/2017 CLINICAL DATA:  Back pain last night worsen with deep breath EXAM: CHEST  2 VIEW COMPARISON:  July 29, 2015 FINDINGS: The heart size and mediastinal contours are within normal limits. There is no focal infiltrate, pulmonary edema, or pleural effusion. Scoliosis of the spine is noted. IMPRESSION: No active cardiopulmonary disease. Electronically Signed   By: Sherian ReinWei-Chen  Lin M.D.   On: 06/10/2017 13:48    Procedures Procedures (including critical care time)  Medications Ordered in UC Medications - No data to display   Initial Impression / Assessment and Plan / UC Course  I have reviewed the triage vital signs and the nursing notes.  Pertinent labs & imaging results that were available during my care of the patient were reviewed by me and considered in my medical decision making (see chart for details).    Xray negative for acute findings. History and exam consistent with muscular strain to right upper back. Heat, stretching, nsaids for pain control. Return precautions provided. Patient verbalized understanding and agreeable to plan.   Final Clinical Impressions(s) / UC Diagnoses   Final diagnoses:  Upper back pain on right side  Medication refill  History of herpes genitalis    ED Discharge Orders        Ordered    valACYclovir (VALTREX) 500 MG tablet  2 times daily     06/10/17 1347    naproxen (NAPROSYN) 375 MG tablet  2 times daily     06/10/17 1359       Controlled Substance Prescriptions [place] Controlled Substance Registry consulted? Not Applicable   Georgetta HaberBurky,  Natalie B, NP 06/10/17 [phone]

## 2017-06-10 NOTE — Discharge Instructions (Signed)
Heat application may be helpful, light activity and stretching as well. Naproxen, twice a day, take with food.  If develop shortness of breath, dif

## 2018-03-19 ENCOUNTER — Emergency Department (HOSPITAL_COMMUNITY)
Admission: EM | Admit: 2018-03-19 | Discharge: 2018-03-20 | Disposition: A | Discharge status: Home / Self Care | Payer: Medicaid Other | Attending: Emergency Medicine | Admitting: Emergency Medicine

## 2018-03-19 ENCOUNTER — Encounter (HOSPITAL_COMMUNITY): Payer: Self-pay

## 2018-03-19 DIAGNOSIS — Y939 Activity, unspecified: Secondary | ICD-10-CM | POA: Insufficient documentation

## 2018-03-19 DIAGNOSIS — W25XXXA Contact with sharp glass, initial encounter: Secondary | ICD-10-CM | POA: Diagnosis not present

## 2018-03-19 DIAGNOSIS — Y999 Unspecified external cause status: Secondary | ICD-10-CM | POA: Insufficient documentation

## 2018-03-19 DIAGNOSIS — Y929 Unspecified place or not applicable: Secondary | ICD-10-CM | POA: Diagnosis not present

## 2018-03-19 DIAGNOSIS — Z79899 Other long term (current) drug therapy: Secondary | ICD-10-CM | POA: Diagnosis not present

## 2018-03-19 DIAGNOSIS — F17210 Nicotine dependence, cigarettes, uncomplicated: Secondary | ICD-10-CM | POA: Diagnosis not present

## 2018-03-19 DIAGNOSIS — Z7982 Long term (current) use of aspirin: Secondary | ICD-10-CM | POA: Diagnosis not present

## 2018-03-19 DIAGNOSIS — S61412A Laceration without foreign body of left hand, initial encounter: Secondary | ICD-10-CM | POA: Insufficient documentation

## 2018-03-19 MED ORDER — TETANUS-DIPHTH-ACELL PERTUSSIS 5-2.5-18.5 LF-MCG/0.5 IM SUSP: 0.5000 mL | Freq: Once | INTRAMUSCULAR | Status: DC

## 2018-03-19 NOTE — ED Triage Notes (Signed)
Pt states that she cut her L hand on a piece of glass. Small puncture wound to L palm, last tetanus unknown.

## 2018-03-20 ENCOUNTER — Emergency Department (HOSPITAL_COMMUNITY): Payer: Medicaid Other

## 2018-03-20 MED ORDER — ACETAMINOPHEN 325 MG PO TABS
650.0000 mg | ORAL_TABLET | Freq: Once | ORAL | Status: AC
  Administered 2018-03-20: 650 mg via ORAL

## 2018-03-20 NOTE — ED Notes (Signed)
Patient verbalizes understanding of discharge instructions. Opportunity for questioning and answers were provided. Armband removed by staff, pt discharged from ED home via POV.  

## 2018-03-20 NOTE — Discharge Instructions (Addendum)
Your xray did not show signs of foreign body. You were seen here today because of a laceration. A laceration is a cut or lesion that goes through all layers of the skin and into the tissue just beneath the skin. Your laceration has been repaired with dermabond. The film will usually remain in place for 5-10 days, then naturally fall off your skin. Keep the bandaging dry. Replace the dressing daily until the adhesive film has fallen off or if the bandage should become wet. When changing the dressing, do not apply tape directly over the dermabond adhesive film as removing the tape later may also remove the film. Do not apply topical liquids or ointments to the area while the dermabond is in place. This may loosen the film. You may occasionally breifely wet your wound in a shower or bath. Do not soak or scrub your wound. Do not swim. Avoid periods of heavy perspiration. After showering, gently blot your wound dry with a soft towel and apply new clean bandage. Protect your wound from injury. Do not scratch, rub or pick at the Dermabond film.   SEEK MEDICAL CARE IF:  You have redness, swelling, or increasing pain in the wound.  You see a red line that goes away from the wound.  You have yellowish-white fluid (pus) coming from the wound.  You have a fever.  You notice a bad smell coming from the wound or dressing.  Your wound breaks open before or after sutures have been removed.  You notice something coming out of the wound such as wood or glass.  Your wound is on your hand or foot and you cannot move a finger or toe.  Your pain is not controlled with prescribed medicine.  Additional Information:  If you did not receive a tetanus shot today because you thought you were up to date, but did not recall when your last one was given, make sure to check with your primary caregiver to determine if you need one.   Your vital signs today were: BP 117/83 (BP Location: Right Arm)    Pulse 83    Temp 98 F (36.7 C)  (Oral)    Resp 16    LMP 02/28/2018    SpO2 98%  If your blood pressure (BP) was elevated above 135/85 this visit, please have this repeated by your doctor within one month.Marland Kitchen

## 2018-03-20 NOTE — ED Notes (Signed)
Pt reports she accidentally stabbed herself with a piece of glass. Pt pulled same out.

## 2018-03-20 NOTE — ED Notes (Signed)
Dermabond at bedside.  

## 2018-03-20 NOTE — ED Provider Notes (Signed)
[facility] EMERGENCY DEPARTMENT Provider Note   CSN: [id number] Arrival date & time: 03/19/18  2315     History   Chief Complaint Chief Complaint  Patient presents with  . Laceration    HPI Teresa Burns is a 32 y.o. female with no significant past medical history presents emergency department today for left hand laceration.  Patient reports that she was cleaning out from underneath of her bed when an old piece of glass caught her on her lateral aspect of her hand.  She notes a puncture wound to this area.  No decreased range of motion, numbness/tingling/weakness of the fingers.  Bleeding is currently controlled.  She is unsure of her last tetanus shot.  No interventions prior to arrival. The patient is right handed.  HPI  Past Medical History:  Diagnosis Date  . Herpes   . Sickle cell trait [facility])     Patient Active Problem List   Diagnosis Date Noted  . Encounter for general counseling on prescription of oral contraceptives 10/21/2015  . Pleuritic chest pain 07/15/2015  . Herpes genitalis 07/15/2015    History reviewed. No pertinent surgical history.   OB History   None      Home Medications    Prior to Admission medications   Medication Sig Start Date End Date Taking? Authorizing Provider  acetaminophen (TYLENOL) 500 MG tablet Take 1,000 mg by mouth every 6 (six) hours as needed for headache.    [provider]  aspirin-acetaminophen-caffeine (EXCEDRIN MIGRAINE) [phone] MG tablet Take 2 tablets by mouth every 6 (six) hours as needed for headache.    [provider]  Aspirin-Caffeine (BC FAST PAIN RELIEF PO) Take 1 packet by mouth every 8 (eight) hours as needed (headache).    [provider]  naproxen (NAPROSYN) 375 MG tablet Take 1 tablet (375 mg total) by mouth 2 (two) times daily. 06/10/17   Georgetta Haber, NP    Family History Family History  Problem Relation Age of Onset  . Cancer Maternal Grandmother       Social History Social History   Tobacco Use  . Smoking status: Current Some Day Smoker    Packs/day: 0.50    Types: Cigarettes  . Smokeless tobacco: Never Used  . Tobacco comment: 1/2PPD  Substance Use Topics  . Alcohol use: Yes    Alcohol/week: 0.0 standard drinks    Comment: pt states 8 to 10 shots nightly  . Drug use: Yes    Types: Marijuana     Allergies   Patient has no known allergies.   Review of Systems Review of Systems  Musculoskeletal: Negative for arthralgias.  Skin: Positive for wound.  Neurological: Negative for weakness and numbness.     Physical Exam Updated Vital Signs BP 117/83 (BP Location: Right Arm)   Pulse 83   Temp 98 F (36.7 C) (Oral)   Resp 16   LMP 02/28/2018   SpO2 98%   Physical Exam  Constitutional: She appears well-developed and well-nourished.  HENT:  Head: Normocephalic and atraumatic.  Right Ear: External ear normal.  Left Ear: External ear normal.  Eyes: Conjunctivae are normal. Right eye exhibits no discharge. Left eye exhibits no discharge. No scleral icterus.  Cardiovascular:  Pulses:      Radial pulses are 2+ on the left side.  Pulmonary/Chest: Effort normal. No respiratory distress.  Musculoskeletal:       Left wrist: Normal.       Left hand: She exhibits tenderness  and laceration. She exhibits normal range of motion and no bony tenderness. Normal sensation noted. Normal strength noted.       Hands: Normal resisted rom for flexion and extension of all digits to the left hand. Sensation intact distally. Cap refill <2 seconds.   Neurological: She is alert. She has normal strength. No sensory deficit.  Skin: Skin is warm and dry. Capillary refill takes less than 2 seconds. Laceration noted. No pallor.  Psychiatric: She has a normal mood and affect.  Nursing note and vitals reviewed.    ED Treatments / Results  Labs (all labs ordered are listed, but only abnormal results are displayed) Labs Reviewed - No data  to display  EKG None  Radiology Dg Hand Complete Left  Result Date: 03/20/2018 CLINICAL DATA:  Cut hand. EXAM: LEFT HAND - COMPLETE 3+ VIEW COMPARISON:  None. FINDINGS: There is no evidence of fracture or dislocation. There is no evidence of arthropathy or other focal bone abnormality. Soft tissues are unremarkable. IMPRESSION: Negative. Electronically Signed   By: Charlett Nose M.D.   On: 03/20/2018 00:15    Procedures .Marland KitchenLaceration Repair Date/Time: 03/20/2018 12:18 AM Performed by: Jacinto Halim, PA-C Authorized by: Jacinto Halim, PA-C   Consent:    Consent obtained:  Verbal   Consent given by:  Patient   Risks discussed:  Infection, need for additional repair, nerve damage, poor wound healing, poor cosmetic result, pain, retained foreign body, vascular damage and tendon damage   Alternatives discussed:  No treatment Anesthesia (see MAR for exact dosages):    Anesthesia method:  None Laceration details:    Location:  Hand   Hand location:  L palm   Length (cm):  0.5 Repair type:    Repair type:  Simple Pre-procedure details:    Preparation:  Patient was prepped and draped in usual sterile fashion and imaging obtained to evaluate for foreign bodies Exploration:    Hemostasis achieved with:  Direct pressure   Wound exploration: wound explored through full range of motion and entire depth of wound probed and visualized     Wound extent: no foreign bodies/material noted     Contaminated: no   Treatment:    Area cleansed with:  Saline   Amount of cleaning:  Standard   Irrigation solution:  Sterile saline   Irrigation method:  Syringe   Visualized foreign bodies/material removed: no   Skin repair:    Repair method:  Tissue adhesive Approximation:    Approximation:  Close Post-procedure details:    Dressing:  Open (no dressing)   Patient tolerance of procedure:  Tolerated well, no immediate complications   (including critical care time)  Medications Ordered in  ED Medications  Tdap (BOOSTRIX) injection 0.5 mL (has no administration in time range)  acetaminophen (TYLENOL) tablet 650 mg (has no administration in time range)     Initial Impression / Assessment and Plan / ED Course  I have reviewed the triage vital signs and the nursing notes.  Pertinent labs & imaging results that were available during my care of the patient were reviewed by me and considered in my medical decision making (see chart for details).     32 y.o. female with two small puncture wounds after rubbing her hand across glass on the ground. She is NVI. No evidence of FB or fracture on xray. Imaging reviewed personally. No evidence of ligament injury. Pressure irrigation performed. Wound explored and base of wound visualized in a bloodless field without evidence  of foreign body.  Laceration occurred < 8 hours prior to repair which was well tolerated.  Tdap updated.  Pt has  no comorbidities to effect normal wound healing. Pt discharged  without antibiotics.  Discussed dermabond home care with patient and answered questions. Pt to follow-up for wound check  in 7 days; they are to return to the ED sooner for signs of infection. Pt is hemodynamically stable with no complaints prior to dc.   Final Clinical Impressions(s) / ED Diagnoses   Final diagnoses:  Laceration of left hand without foreign body, initial encounter    ED Discharge Orders    None       Princella Pellegrini 03/20/18 0019    Geoffery Lyons, MD 03/20/18 Earle Gell

## 2018-03-29 ENCOUNTER — Other Ambulatory Visit: Payer: Self-pay

## 2018-03-29 DIAGNOSIS — Z1231 Encounter for screening mammogram for malignant neoplasm of breast: Secondary | ICD-10-CM

## 2018-04-28 ENCOUNTER — Ambulatory Visit (HOSPITAL_COMMUNITY)
Admission: EM | Admit: 2018-04-28 | Discharge: 2018-04-28 | Disposition: A | Discharge status: Home / Self Care | Payer: Medicaid Other

## 2018-04-28 ENCOUNTER — Encounter (HOSPITAL_COMMUNITY): Payer: Self-pay | Admitting: Emergency Medicine

## 2018-04-28 ENCOUNTER — Telehealth (HOSPITAL_COMMUNITY): Payer: Self-pay

## 2018-04-28 DIAGNOSIS — N644 Mastodynia: Secondary | ICD-10-CM | POA: Diagnosis not present

## 2018-04-28 NOTE — ED Triage Notes (Signed)
Pt here for pain on left breast onset 3 weeks associated w/augmentation of left breast that she has noticed.   Denies fevers, nipple d/c   LMP = 03/31/2018  A&O x4... NAD.... Ambulatory

## 2018-04-28 NOTE — Discharge Instructions (Addendum)
We will order the mammogram and US of the breast If they haven't called by Monday please call the breast center Ibuprofen for the pain and inflammation.

## 2018-04-28 NOTE — ED Provider Notes (Signed)
[facility]    CSN: [id number] Arrival date & time: 04/28/18  4098     History   Chief Complaint Chief Complaint  Patient presents with  . Breast Pain    HPI Teresa Burns is a 32 y.o. female.   Patient is a 32 year old female presents for left breast pain that has been there for approximately 3 weeks and worsening.  She describes it as tenderness mostly upon touching the breast.  No lumps felt.  She reports increase in size of the left breast with increased warmth.  She denies any trauma to the breast or nipple discharge.  She is here with concerns of breast cancer based on family history.  Denies any fever, chills, body aches, night sweats, fatigue.  ROS per HPI      Past Medical History:  Diagnosis Date  . Herpes   . Sickle cell trait [facility])     Patient Active Problem List   Diagnosis Date Noted  . Encounter for general counseling on prescription of oral contraceptives 10/21/2015  . Pleuritic chest pain 07/15/2015  . Herpes genitalis 07/15/2015    History reviewed. No pertinent surgical history.  OB History   None      Home Medications    Prior to Admission medications   Medication Sig Start Date End Date Taking? Authorizing Provider  acetaminophen (TYLENOL) 500 MG tablet Take 1,000 mg by mouth every 6 (six) hours as needed for headache.    [provider]  aspirin-acetaminophen-caffeine (EXCEDRIN MIGRAINE) [phone] MG tablet Take 2 tablets by mouth every 6 (six) hours as needed for headache.    [provider]  Aspirin-Caffeine (BC FAST PAIN RELIEF PO) Take 1 packet by mouth every 8 (eight) hours as needed (headache).    [provider]  naproxen (NAPROSYN) 375 MG tablet Take 1 tablet (375 mg total) by mouth 2 (two) times daily. 06/10/17   Georgetta Haber, NP    Family History Family History  Problem Relation Age of Onset  . Cancer Maternal Grandmother     Social History Social History   Tobacco Use  .  Smoking status: Current Some Day Smoker    Packs/day: 0.50    Types: Cigarettes  . Smokeless tobacco: Never Used  . Tobacco comment: 1/2PPD  Substance Use Topics  . Alcohol use: Yes    Alcohol/week: 0.0 standard drinks    Comment: pt states 8 to 10 shots nightly  . Drug use: Yes    Types: Marijuana     Allergies   Patient has no known allergies.   Review of Systems Review of Systems   Physical Exam Triage Vital Signs ED Triage Vitals  Enc Vitals Group     BP 04/28/18 1014 114/80     Pulse Rate 04/28/18 1014 68     Resp 04/28/18 1014 18     Temp 04/28/18 1014 97.8 F (36.6 C)     Temp Source 04/28/18 1014 Oral     SpO2 04/28/18 1014 98 %     Weight --      Height --      Head Circumference --      Peak Flow --      Pain Score 04/28/18 1016 7     Pain Loc --      Pain Edu? --      Excl. in GC? --    No data found.  Updated Vital Signs BP 114/80 (BP Location: Left Arm)   Pulse 68  Temp 97.8 F (36.6 C) (Oral)   Resp 18   LMP 03/31/2018   SpO2 98%   Visual Acuity Right Eye Distance:   Left Eye Distance:   Bilateral Distance:    Right Eye Near:   Left Eye Near:    Bilateral Near:     Physical Exam  Constitutional: She appears well-developed and well-nourished.  HENT:  Head: Normocephalic.  Eyes: Conjunctivae are normal.  Neck: Normal range of motion.  Cardiovascular:  Tenderness to left breat mostly to lateral part and axilla. No lumps palpated. No erythema, increased warmth, or dimples.    Pulmonary/Chest: Effort normal.  Musculoskeletal: Normal range of motion.  Neurological: She is alert.  Skin: Skin is warm and dry.  Psychiatric: She has a normal mood and affect.  Nursing note and vitals reviewed.    UC Treatments / Results  Labs (all labs ordered are listed, but only abnormal results are displayed) Labs Reviewed - No data to display  EKG None  Radiology No results found.  Procedures Procedures (including critical care  time)  Medications Ordered in UC Medications - No data to display  Initial Impression / Assessment and Plan / UC Course  I have reviewed the triage vital signs and the nursing notes.  Pertinent labs & imaging results that were available during my care of the patient were reviewed by me and considered in my medical decision making (see chart for details).     No abnormalities noted on exam Pt very tender to the left lateral breat and axilla. No lymph node swelling or lumps appreciated.  Will send for US and mammogram based on family hx and concern.  Pt will follow up with the breast center.  Ibuprofen for pain and inflammation.  Final Clinical Impressions(s) / UC Diagnoses   Final diagnoses:  Breast pain     Discharge Instructions     We will order the mammogram and US of the breast If they haven't called by Monday please call the breast center Ibuprofen for the pain and inflammation.       ED Prescriptions    None     Controlled Substance Prescriptions [place] Controlled Substance Registry consulted? Not Applicable   Janace ArisBast, Luetta Piazza A, NP 04/28/18 1115

## 2018-05-01 ENCOUNTER — Other Ambulatory Visit: Payer: Self-pay | Admitting: Family Medicine

## 2018-05-01 DIAGNOSIS — N644 Mastodynia: Secondary | ICD-10-CM

## 2018-05-02 ENCOUNTER — Ambulatory Visit
Admission: RE | Admit: 2018-05-02 | Discharge: 2018-05-02 | Disposition: A | Discharge status: Home / Self Care | Payer: Medicaid Other | Source: Ambulatory Visit | Attending: Family Medicine | Admitting: Family Medicine

## 2018-05-02 ENCOUNTER — Other Ambulatory Visit: Payer: Self-pay | Admitting: Family Medicine

## 2018-05-02 DIAGNOSIS — N644 Mastodynia: Secondary | ICD-10-CM

## 2018-05-02 DIAGNOSIS — N6320 Unspecified lump in the left breast, unspecified quadrant: Secondary | ICD-10-CM

## 2018-05-03 ENCOUNTER — Other Ambulatory Visit: Payer: Medicaid Other

## 2018-11-07 ENCOUNTER — Other Ambulatory Visit: Payer: Medicaid Other

## 2018-12-04 IMAGING — US ULTRASOUND LEFT BREAST LIMITED
1 series · 6 of 6 positions shown · non-contrast
Comparison: None

CLINICAL DATA: Patient presents reporting a tender palpable lump in
the upper outer left breast. Pain began approximately 3 weeks ago
becoming severe enough to warrant a visit at an [HOSPITAL]
facility. She subsequently took Motrin for pain with improvement.

EXAM:
DIGITAL DIAGNOSTIC BILATERAL MAMMOGRAM WITH CAD AND TOMO
ULTRASOUND LEFT BREAST

[Series 2: ultrasound left breast limited · 0.04mm/px · 6 of 6 slices shown]
[im 1/6]
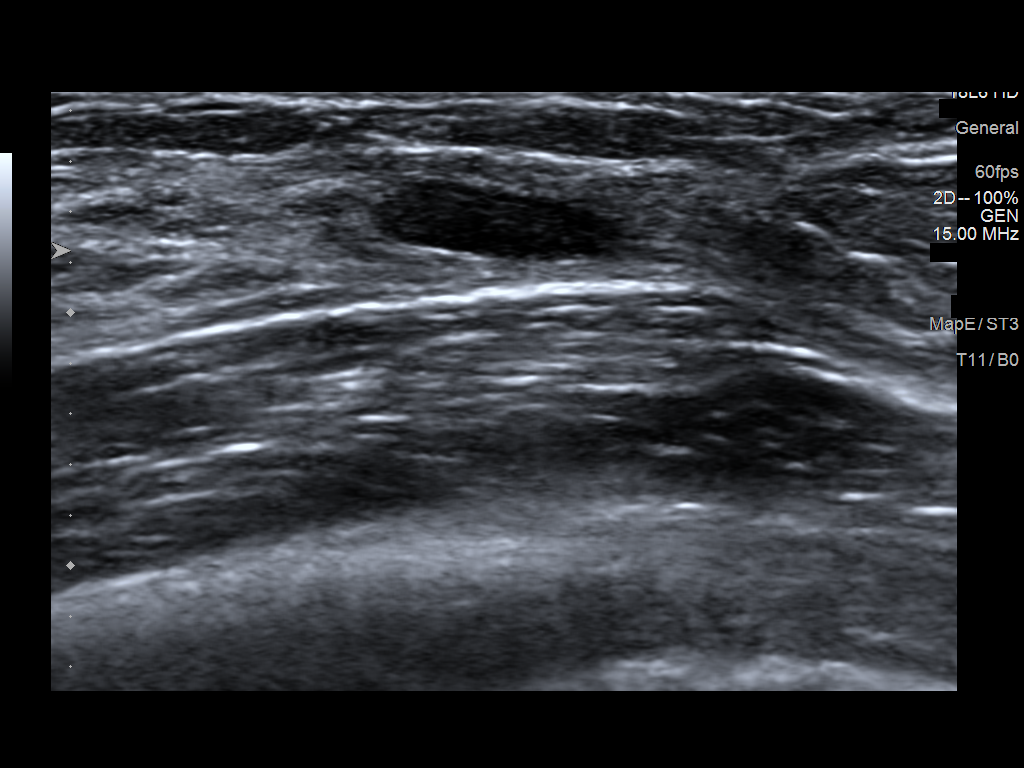
[im 2/6]
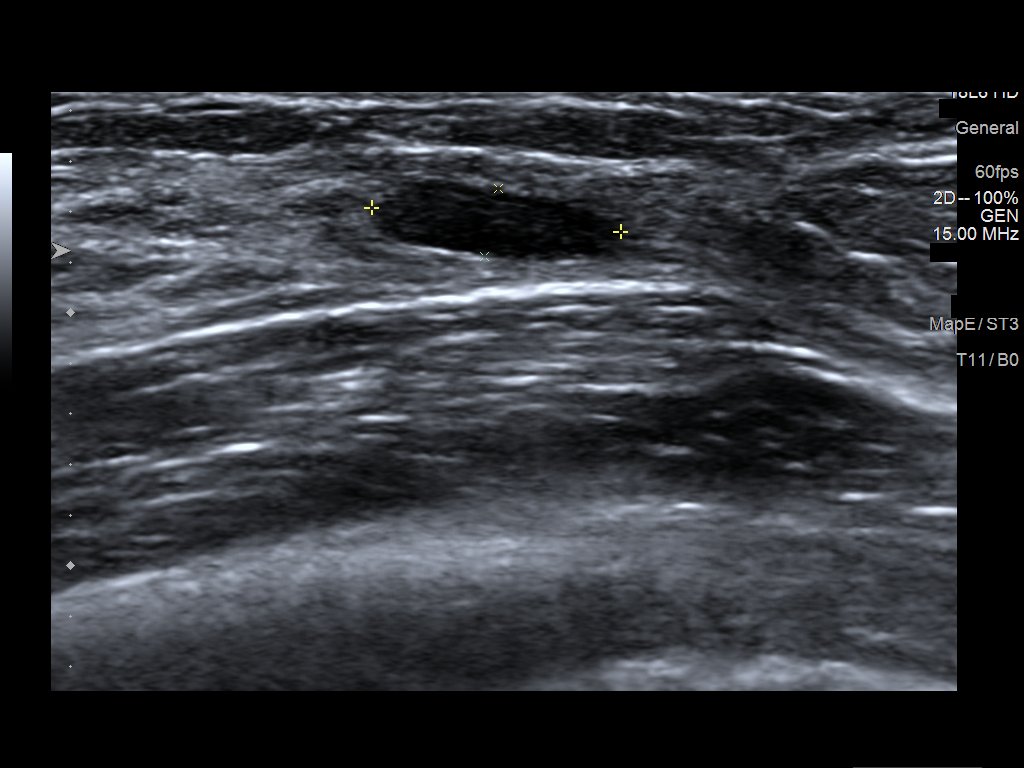
[im 3/6]
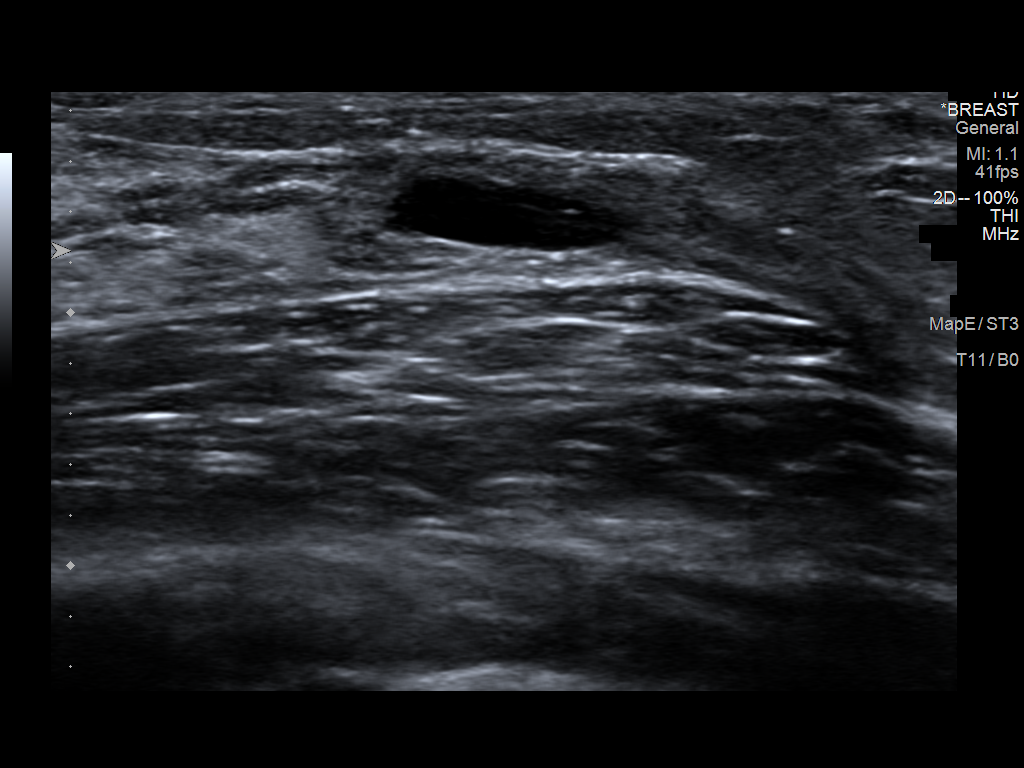
[im 4/6]
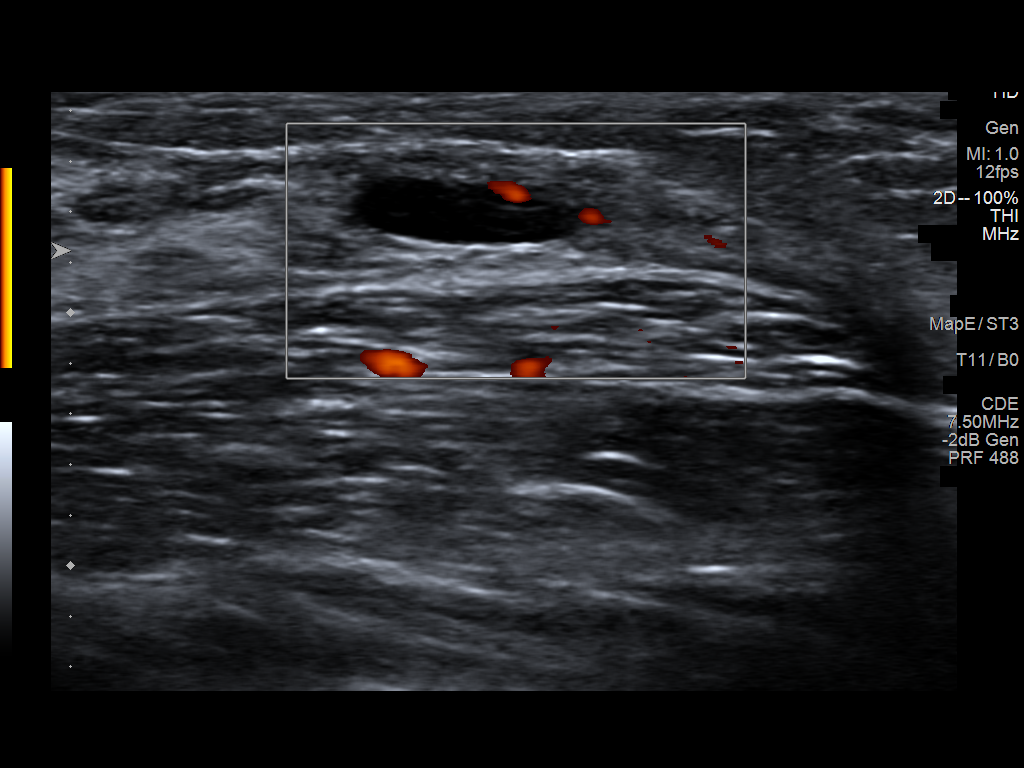
[im 5/6]
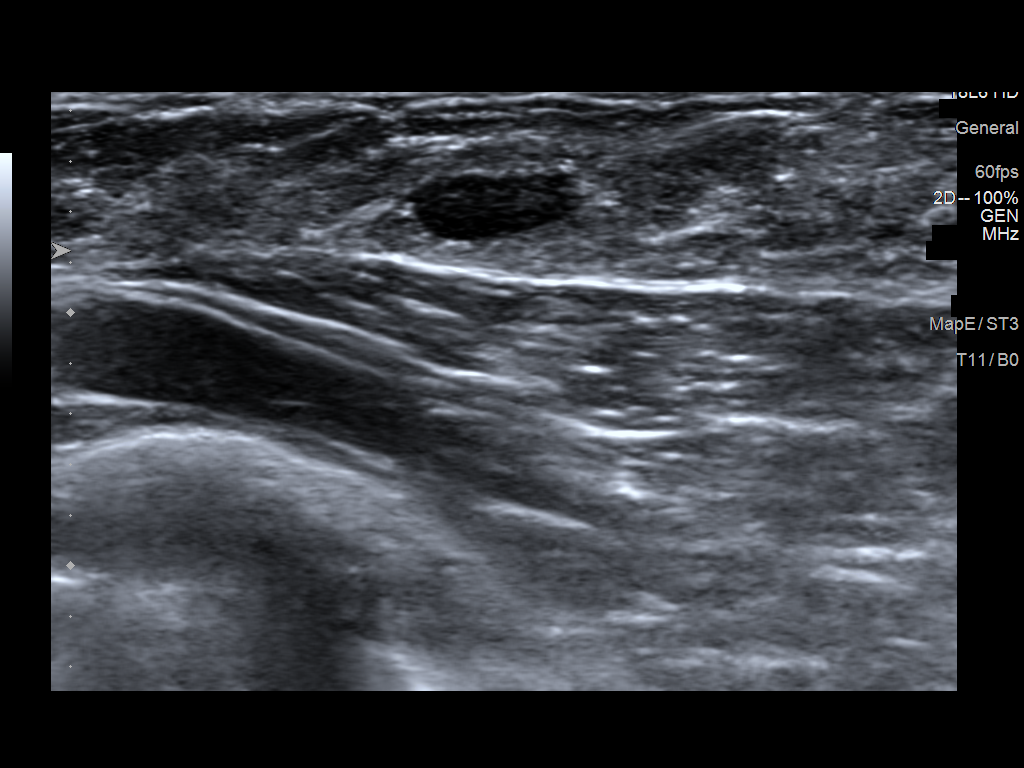
[im 6/6]
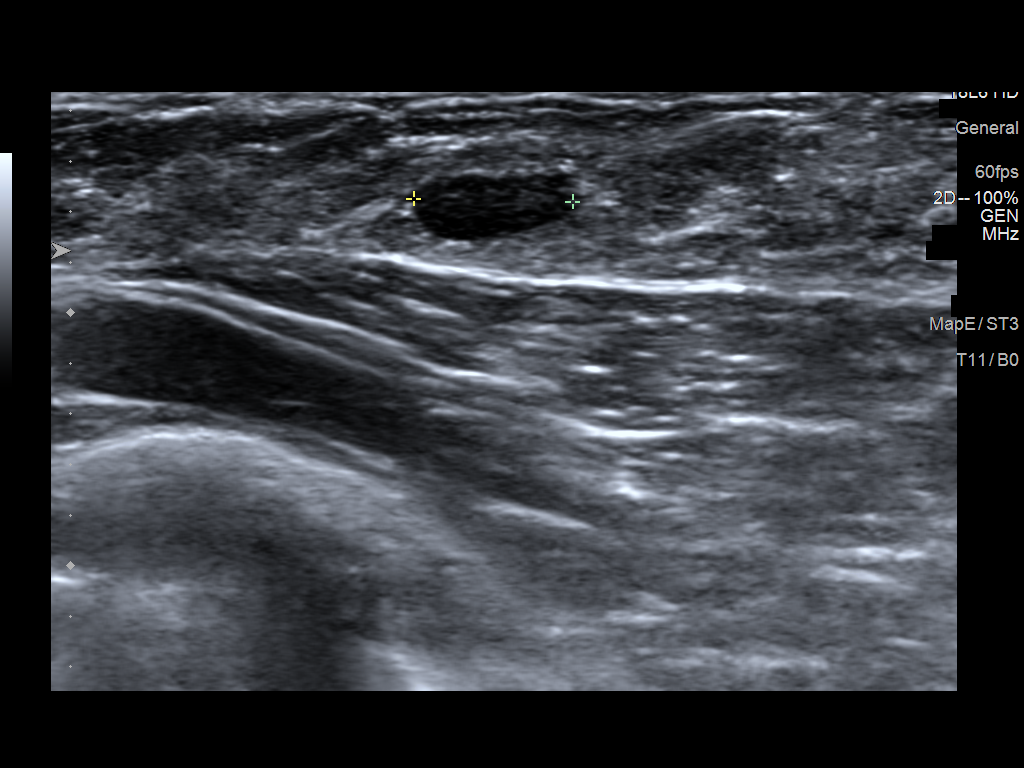

[6 of 6 positions shown; findings below may reference images not displayed]

ACR Breast Density Category c: The breast tissue is heterogeneously
dense, which may obscure small masses.
FINDINGS: There are no discrete masses, areas of architectural distortion,
areas of significant asymmetry or suspicious calcifications.

Mammographic images were processed with CAD.

On physical exam, there is prominent and tender fibroglandular
tissue in the upper outer left breast without a discrete palpable
mass.

Targeted ultrasound is performed, showing heterogeneous
fibroglandular tissue in the upper outer left breast corresponding
to the area of tenderness. Within this there is an oval,
circumscribed, parallel, hypo to anechoic mass measuring 10 x 3 x 6
mm, lying at 2 o'clock, 5 cm the nipple. Color Doppler shows flow
along the mass periphery. This is most likely a complicated cyst. No
other sonographic abnormality.
IMPRESSION: 1. Probably benign mass in the left breast at 2 o'clock, most likely
a complicated cyst. Short-term follow-up recommended.

RECOMMENDATION:
Left breast ultrasound in 6 months.

I have discussed the findings and recommendations with the patient.
Results were also provided in writing at the conclusion of the
visit. If applicable, a reminder letter will be sent to the patient
regarding the next appointment.

BI-RADS CATEGORY  3: Probably benign.

## 2019-06-29 ENCOUNTER — Other Ambulatory Visit (HOSPITAL_COMMUNITY): Payer: Self-pay | Admitting: Family Medicine

## 2019-06-29 DIAGNOSIS — N6320 Unspecified lump in the left breast, unspecified quadrant: Secondary | ICD-10-CM

## 2020-07-30 ENCOUNTER — Other Ambulatory Visit: Payer: Self-pay

## 2020-07-30 ENCOUNTER — Encounter (HOSPITAL_COMMUNITY): Payer: Self-pay

## 2020-07-30 ENCOUNTER — Ambulatory Visit (HOSPITAL_COMMUNITY)
Admission: EM | Admit: 2020-07-30 | Discharge: 2020-07-30 | Disposition: A | Discharge status: Home / Self Care | Payer: Medicaid Other | Attending: Emergency Medicine | Admitting: Emergency Medicine

## 2020-07-30 DIAGNOSIS — M546 Pain in thoracic spine: Secondary | ICD-10-CM | POA: Diagnosis not present

## 2020-07-30 MED ORDER — IBUPROFEN 800 MG PO TABS: 800.0000 mg | ORAL_TABLET | Freq: Three times a day (TID) | ORAL | 0 refills | Status: DC | PRN

## 2020-07-30 MED ORDER — CYCLOBENZAPRINE HCL 10 MG PO TABS: 10.0000 mg | ORAL_TABLET | Freq: Two times a day (BID) | ORAL | 0 refills | Status: DC | PRN

## 2020-07-30 NOTE — ED Triage Notes (Signed)
Pt reports left sided back pain x 5 days. States pain started when she was on the ground taking something out of her car. Pt has not taken any OTC meds for the pain.

## 2020-07-30 NOTE — Discharge Instructions (Addendum)
Take ibuprofen as needed for discomfort.  Take the muscle relaxer as needed for muscle spasm; Do not drive, operate machinery, or drink alcohol with this medication as it can cause drowsiness.   Follow up with your primary care provider or an orthopedist if your symptoms are not improving.     

## 2020-07-30 NOTE — ED Provider Notes (Signed)
[facility]    CSN: [id number] Arrival date & time: 07/30/20  1802      History   Chief Complaint Chief Complaint  Patient presents with  . Back Pain    HPI Teresa Burns is a 35 y.o. female.   Patient presents with left mid back pain x5 days.  She states the pain started when she was taking something out of her car and then got worse today with twisting movement.  She denies numbness, weakness, paresthesias, abdominal pain, dysuria, or other symptoms.  Treatment attempted at home with massage.  Her medical history includes HSV and sickle cell trait.  The history is provided by the patient and medical records.    Past Medical History:  Diagnosis Date  . Herpes   . Sickle cell trait [facility])     Patient Active Problem List   Diagnosis Date Noted  . Encounter for general counseling on prescription of oral contraceptives 10/21/2015  . Pleuritic chest pain 07/15/2015  . Herpes genitalis 07/15/2015    History reviewed. No pertinent surgical history.  OB History   No obstetric history on file.      Home Medications    Prior to Admission medications   Medication Sig Start Date End Date Taking? Authorizing Provider  cyclobenzaprine (FLEXERIL) 10 MG tablet Take 1 tablet (10 mg total) by mouth 2 (two) times daily as needed for muscle spasms. 07/30/20  Yes Mickie Bail, NP  ibuprofen (ADVIL) 800 MG tablet Take 1 tablet (800 mg total) by mouth every 8 (eight) hours as needed. 07/30/20  Yes Mickie Bail, NP  acetaminophen (TYLENOL) 500 MG tablet Take 1,000 mg by mouth every 6 (six) hours as needed for headache.    [provider]  aspirin-acetaminophen-caffeine (EXCEDRIN MIGRAINE) [phone] MG tablet Take 2 tablets by mouth every 6 (six) hours as needed for headache.    [provider]  Aspirin-Caffeine (BC FAST PAIN RELIEF PO) Take 1 packet by mouth every 8 (eight) hours as needed (headache).    [provider]    Family History Family  History  Problem Relation Age of Onset  . Cancer Maternal Grandmother     Social History Social History   Tobacco Use  . Smoking status: Current Some Day Smoker    Packs/day: 0.50    Types: Cigarettes  . Smokeless tobacco: Never Used  . Tobacco comment: 1/2PPD  Substance Use Topics  . Alcohol use: Yes    Alcohol/week: 0.0 standard drinks    Comment: pt states 8 to 10 shots nightly  . Drug use: Yes    Types: Marijuana     Allergies   Patient has no known allergies.   Review of Systems Review of Systems  Constitutional: Negative for chills and fever.  HENT: Negative for ear pain and sore throat.   Eyes: Negative for pain and visual disturbance.  Respiratory: Negative for cough and shortness of breath.   Cardiovascular: Negative for chest pain and palpitations.  Gastrointestinal: Negative for abdominal pain and vomiting.  Genitourinary: Negative for dysuria and hematuria.  Musculoskeletal: Positive for back pain. Negative for arthralgias.  Skin: Negative for color change and rash.  Neurological: Negative for syncope, weakness and numbness.  All other systems reviewed and are negative.    Physical Exam Triage Vital Signs ED Triage Vitals  Enc Vitals Group     BP 07/30/20 1819 125/72     Pulse Rate 07/30/20 1819 75     Resp 07/30/20 1819  20     Temp 07/30/20 1819 98.1 F (36.7 C)     Temp Source 07/30/20 1819 Oral     SpO2 07/30/20 1819 100 %     Weight --      Height --      Head Circumference --      Peak Flow --      Pain Score 07/30/20 1818 10     Pain Loc --      Pain Edu? --      Excl. in GC? --    No data found.  Updated Vital Signs BP 125/72 (BP Location: Right Arm)   Pulse 75   Temp 98.1 F (36.7 C) (Oral)   Resp 20   LMP 07/14/2020 (Exact Date)   SpO2 100%   Visual Acuity Right Eye Distance:   Left Eye Distance:   Bilateral Distance:    Right Eye Near:   Left Eye Near:    Bilateral Near:     Physical Exam Vitals and nursing note  reviewed.  Constitutional:      General: She is not in acute distress.    Appearance: She is well-developed and well-nourished. She is not ill-appearing.  HENT:     Head: Normocephalic and atraumatic.     Mouth/Throat:     Mouth: Mucous membranes are moist.  Eyes:     Conjunctiva/sclera: Conjunctivae normal.  Cardiovascular:     Rate and Rhythm: Normal rate and regular rhythm.     Heart sounds: Normal heart sounds.  Pulmonary:     Effort: Pulmonary effort is normal. No respiratory distress.     Breath sounds: Normal breath sounds.  Abdominal:     Palpations: Abdomen is soft.     Tenderness: There is no abdominal tenderness. There is no guarding or rebound.  Musculoskeletal:        General: Tenderness present. No swelling, deformity or edema. Normal range of motion.     Cervical back: Neck supple.     Comments: Left mid back muscles tender to palpation.  Spine nontender.  Skin:    General: Skin is warm and dry.     Capillary Refill: Capillary refill takes less than 2 seconds.     Findings: Erythema and lesion present. No bruising.     Comments: Small area of faint erythema and scabbed lesion at area where patient indicates her discomfort is.  She states these are from rubbing the area too vigorously.  Neurological:     General: No focal deficit present.     Mental Status: She is alert and oriented to person, place, and time.     Sensory: No sensory deficit.     Motor: No weakness.     Gait: Gait normal.  Psychiatric:        Mood and Affect: Mood and affect and mood normal.        Behavior: Behavior normal.      UC Treatments / Results  Labs (all labs ordered are listed, but only abnormal results are displayed) Labs Reviewed - No data to display  EKG   Radiology No results found.  Procedures Procedures (including critical care time)  Medications Ordered in UC Medications - No data to display  Initial Impression / Assessment and Plan / UC Course  I have  reviewed the triage vital signs and the nursing notes.  Pertinent labs & imaging results that were available during my care of the patient were reviewed by me and considered in my  medical decision making (see chart for details).   Acute left-sided thoracic back pain.  Treating with ibuprofen and Flexeril.  Precautions for drowsiness with Flexeril discussed.  Instructed patient to follow-up with her PCP or an orthopedist if her symptoms are not improving.  She agrees to plan of care.   Final Clinical Impressions(s) / UC Diagnoses   Final diagnoses:  Acute left-sided thoracic back pain     Discharge Instructions     Take ibuprofen as needed for discomfort.  Take the muscle relaxer as needed for muscle spasm; Do not drive, operate machinery, or drink alcohol with this medication as it can cause drowsiness.   Follow up with your primary care provider or an orthopedist if your symptoms are not improving.        ED Prescriptions    Medication Sig Dispense Auth. Provider   ibuprofen (ADVIL) 800 MG tablet Take 1 tablet (800 mg total) by mouth every 8 (eight) hours as needed. 21 tablet Mickie Bail, NP   cyclobenzaprine (FLEXERIL) 10 MG tablet Take 1 tablet (10 mg total) by mouth 2 (two) times daily as needed for muscle spasms. 20 tablet Mickie Bail, NP     PDMP not reviewed this encounter.   Mickie Bail, NP 07/30/20 [phone]

## 2020-10-28 ENCOUNTER — Other Ambulatory Visit: Payer: Self-pay

## 2020-10-28 ENCOUNTER — Encounter (HOSPITAL_COMMUNITY): Payer: Self-pay

## 2020-10-28 ENCOUNTER — Emergency Department (HOSPITAL_COMMUNITY): Payer: Medicaid Other

## 2020-10-28 ENCOUNTER — Emergency Department (HOSPITAL_COMMUNITY)
Admission: EM | Admit: 2020-10-28 | Discharge: 2020-10-28 | Disposition: A | Discharge status: Home / Self Care | Payer: Medicaid Other | Attending: Emergency Medicine | Admitting: Emergency Medicine

## 2020-10-28 DIAGNOSIS — Y9341 Activity, dancing: Secondary | ICD-10-CM | POA: Diagnosis not present

## 2020-10-28 DIAGNOSIS — M25562 Pain in left knee: Secondary | ICD-10-CM | POA: Insufficient documentation

## 2020-10-28 DIAGNOSIS — X501XXA Overexertion from prolonged static or awkward postures, initial encounter: Secondary | ICD-10-CM | POA: Insufficient documentation

## 2020-10-28 DIAGNOSIS — F17210 Nicotine dependence, cigarettes, uncomplicated: Secondary | ICD-10-CM | POA: Diagnosis not present

## 2020-10-28 MED ORDER — MELOXICAM 7.5 MG PO TABS: 7.5000 mg | ORAL_TABLET | Freq: Every day | ORAL | 0 refills | Status: AC

## 2020-10-28 MED ORDER — HYDROCODONE-ACETAMINOPHEN 5-325 MG PO TABS
1.0000 | ORAL_TABLET | Freq: Once | ORAL | Status: AC
Start: 2020-10-28 — End: 2020-10-28
  Administered 2020-10-28: 1 via ORAL
  Filled 2020-10-28: qty 1

## 2020-10-28 MED ORDER — KETOROLAC TROMETHAMINE 30 MG/ML IJ SOLN
30.0000 mg | Freq: Once | INTRAMUSCULAR | Status: AC
  Administered 2020-10-28: 30 mg via INTRAMUSCULAR
  Filled 2020-10-28: qty 1

## 2020-10-28 MED ORDER — HYDROCODONE-ACETAMINOPHEN 5-325 MG PO TABS: 1.0000 | ORAL_TABLET | Freq: Four times a day (QID) | ORAL | 0 refills | Status: DC | PRN

## 2020-10-28 NOTE — Progress Notes (Signed)
Orthopedic Tech Progress Note Patient Details:  ANJANI FEUERBORN 12/17/1985 [id number]  Ortho Devices Type of Ortho Device: Knee Sleeve,Crutches Ortho Device/Splint Location: Left Knee Ortho Device/Splint Interventions: Application   Post Interventions Patient Tolerated: Well   Genelle Bal Haruye Lainez 10/28/2020, 1:56 PM

## 2020-10-28 NOTE — ED Provider Notes (Signed)
[facility] Provider Note   CSN: [id number] Arrival date & time: 10/28/20  1028     History No chief complaint on file.   Teresa Burns is a 35 y.o. female.  HPI Patient presents with knee pain.  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  Pain is worse with motion, not improved with ibuprofen.  She did not fall, has no complaints of trauma nor pain in any other area. She notes that she does have some prior injuries from athletics as a youth, but no history of surgery in the knee.  Since onset overnight pain has been worsening, interfering with ability to sleep.  Past Medical History:  Diagnosis Date  . Herpes   . Sickle cell trait [facility])     Patient Active Problem List   Diagnosis Date Noted  . Encounter for general counseling on prescription of oral contraceptives 10/21/2015  . Pleuritic chest pain 07/15/2015  . Herpes genitalis 07/15/2015    No past surgical history on file.   OB History   No obstetric history on file.     Family History  Problem Relation Age of Onset  . Cancer Maternal Grandmother     Social History   Tobacco Use  . Smoking status: Current Some Day Smoker    Packs/day: 0.50    Types: Cigarettes  . Smokeless tobacco: Never Used  . Tobacco comment: 1/2PPD  Substance Use Topics  . Alcohol use: Yes    Alcohol/week: 0.0 standard drinks    Comment: pt states 8 to 10 shots nightly  . Drug use: Yes    Types: Marijuana    Home Medications Prior to Admission medications   Medication Sig Start Date End Date Taking? Authorizing Provider  HYDROcodone-acetaminophen (NORCO/VICODIN) 5-325 MG tablet Take 1 tablet by mouth every 6 (six) hours as needed for severe pain. 10/28/20  Yes Gerhard Munch, MD  meloxicam (MOBIC) 7.5 MG tablet Take 1 tablet (7.5 mg total) by mouth daily  for 7 days. 10/28/20 11/04/20 Yes Gerhard Munch, MD  acetaminophen (TYLENOL) 500 MG tablet Take 1,000 mg by mouth every 6 (six) hours as needed for headache.    [provider]  aspirin-acetaminophen-caffeine (EXCEDRIN MIGRAINE) [phone] MG tablet Take 2 tablets by mouth every 6 (six) hours as needed for headache.    [provider]  Aspirin-Caffeine (BC FAST PAIN RELIEF PO) Take 1 packet by mouth every 8 (eight) hours as needed (headache).    [provider]  cyclobenzaprine (FLEXERIL) 10 MG tablet Take 1 tablet (10 mg total) by mouth 2 (two) times daily as needed for muscle spasms. 07/30/20   Mickie Bail, NP  ibuprofen (ADVIL) 800 MG tablet Take 1 tablet (800 mg total) by mouth every 8 (eight) hours as needed. 07/30/20   Mickie Bail, NP    Allergies    Patient has no known allergies.  Review of Systems   Review of Systems  Constitutional:       Per HPI, otherwise negative  HENT:       Per HPI, otherwise negative  Respiratory:       Per HPI, otherwise negative  Cardiovascular:       Per HPI, otherwise negative  Gastrointestinal: Negative for vomiting.  Endocrine:       Negative aside from HPI  Genitourinary:       Neg aside  from HPI   Musculoskeletal:       Per HPI, otherwise negative  Skin: Negative.   Neurological: Negative for syncope.    Physical Exam Updated Vital Signs BP 114/68   Pulse 74   Temp 98.4 F (36.9 C) (Oral)   Resp 16   LMP 10/11/2020   SpO2 95%   Physical Exam Vitals and nursing note reviewed.  Constitutional:      General: She is not in acute distress.    Appearance: She is well-developed.  HENT:     Head: Normocephalic and atraumatic.  Eyes:     Conjunctiva/sclera: Conjunctivae normal.  Cardiovascular:     Rate and Rhythm: Normal rate and regular rhythm.     Pulses: Normal pulses.  Pulmonary:     Effort: Pulmonary effort is normal. No respiratory distress.     Breath sounds: No stridor.  Abdominal:      General: There is no distension.  Musculoskeletal:       Legs:  Skin:    General: Skin is warm and dry.  Neurological:     Mental Status: She is alert and oriented to person, place, and time.     Cranial Nerves: No cranial nerve deficit.     ED Results / Procedures / Treatments   Labs (all labs ordered are listed, but only abnormal results are displayed) Labs Reviewed - No data to display  EKG None  Radiology DG Knee Complete 4 Views Left  Result Date: 10/28/2020 CLINICAL DATA:  Pain EXAM: LEFT KNEE - COMPLETE 4+ VIEW COMPARISON:  March 20, 2008 FINDINGS: Frontal, lateral, and bilateral oblique views were obtained. No evident fracture or dislocation. No appreciable joint effusion. Joint spaces appear normal. No erosive change. IMPRESSION: No fracture, dislocation, or joint effusion. No evident arthropathic change. Electronically Signed   By: Bretta Bang III M.D.   On: 10/28/2020 12:28    Procedures Procedures   Medications Ordered in ED Medications  HYDROcodone-acetaminophen (NORCO/VICODIN) 5-325 MG per tablet 1 tablet (has no administration in time range)  ketorolac (TORADOL) 30 MG/ML injection 30 mg (30 mg Intramuscular Given 10/28/20 1148)    ED Course  I have reviewed the triage vital signs and the nursing notes.  Pertinent labs & imaging results that were available during my care of the patient were reviewed by me and considered in my medical decision making (see chart for details).    1:23 PM Patient continues to complain of pain in her knee, but has no other complaints.  I reviewed the x-ray, no evidence for fracture, dislocation, effusion.  Some suspicion for enthesitis.  Low suspicion for DVT or other acute phenomena such as cellulitis.  Patient had relative immobilization with Ace wrap, received crutches, analgesics, anti-inflammatories, will follow up with orthopedics.  Final Clinical Impression(s) / ED Diagnoses Final diagnoses:  Acute pain of left knee     Rx / DC Orders ED Discharge Orders         Ordered    meloxicam (MOBIC) 7.5 MG tablet  Daily        10/28/20 1322    HYDROcodone-acetaminophen (NORCO/VICODIN) 5-325 MG tablet  Every 6 hours PRN        10/28/20 1322           Gerhard Munch, MD 10/28/20 1325

## 2020-10-28 NOTE — Discharge Instructions (Signed)
As discussed, there is suspicion for a soft tissue injury contributing to your knee pain.  Please use the provided Ace wrap to keep it relatively immobilized, and the crutches to assist ambulation.  Please take all medication as directed and schedule follow-up with our orthopedic colleagues.  Return here for concerning changes in your condition.

## 2020-10-28 NOTE — ED Triage Notes (Signed)
Patient present with c/o left knee pain. Patient pain is 8/10.

## 2021-06-10 ENCOUNTER — Telehealth: Payer: Medicaid Other | Admitting: Nurse Practitioner

## 2021-06-10 DIAGNOSIS — S59902A Unspecified injury of left elbow, initial encounter: Secondary | ICD-10-CM

## 2021-06-10 NOTE — Progress Notes (Signed)
Virtual Visit Consent   Teresa Burns, you are scheduled for a virtual visit with Mary-Margaret Daphine DeutscherMartin, FNP, a [facility] provider, today.     Just as with appointments in the office, your consent must be obtained to participate.  Your consent will be active for this visit and any virtual visit you may have with one of our providers in the next 365 days.     If you have a MyChart account, a copy of this consent can be sent to you electronically.  All virtual visits are billed to your insurance company just like a traditional visit in the office.    As this is a virtual visit, video technology does not allow for your provider to perform a traditional examination.  This may limit your provider's ability to fully assess your condition.  If your provider identifies any concerns that need to be evaluated in person or the need to arrange testing (such as labs, EKG, etc.), we will make arrangements to do so.     Although advances in technology are sophisticated, we cannot ensure that it will always work on either your end or our end.  If the connection with a video visit is poor, the visit may have to be switched to a telephone visit.  With either a video or telephone visit, we are not always able to ensure that we have a secure connection.     I need to obtain your verbal consent now.   Are you willing to proceed with your visit today? YES   Teresa Burns has provided verbal consent on 06/10/2021 for a virtual visit (video or telephone).   Mary-Margaret Daphine DeutscherMartin, FNP   Date: 06/10/2021 9:46 AM   Virtual Visit via Video Note   I, Mary-Margaret Daphine DeutscherMartin, connected with Teresa Burns ([id number], 01/30/1986) on 06/10/21 at  9:45 AM EST by a video-enabled telemedicine application and verified that I am speaking with the correct person using two identifiers.  Location: Patient: Virtual Visit Location Patient: Home Provider: Virtual Visit Location Provider: Mobile   I discussed the limitations of  evaluation and management by telemedicine and the availability of in person appointments. The patient expressed understanding and agreed to proceed.    History of Present Illness: Teresa Burns is a 36 y.o. who identifies as a female who was assigned female at birth, and is being seen today for arm pain.  HPI: Arm Pain  The incident occurred 5 to 7 days ago. The incident occurred at home (and fail down a flight of starirs). The injury mechanism was a fall. The pain is present in the left elbow. The quality of the pain is described as shooting and stabbing. The pain radiates to the left hand. The pain is at a severity of 7/10. The pain is moderate. The pain has been Constant since the incident. Pertinent negatives include no numbness or tingling. The symptoms are aggravated by movement. She has tried ice, immobilization and NSAIDs for the symptoms. The treatment provided mild relief.   Review of Systems  Neurological:  Negative for tingling and numbness.   Problems:  Patient Active Problem List   Diagnosis Date Noted   Encounter for general counseling on prescription of oral contraceptives 10/21/2015   Pleuritic chest pain 07/15/2015   Herpes genitalis 07/15/2015    Allergies: No Known Allergies Medications:  Current Outpatient Medications:    acetaminophen (TYLENOL) 500 MG tablet, Take 1,000 mg by mouth every 6 (six) hours as needed for headache.,  Disp: , Rfl:    aspirin-acetaminophen-caffeine (EXCEDRIN MIGRAINE) 250-250-65 MG tablet, Take 2 tablets by mouth every 6 (six) hours as needed for headache., Disp: , Rfl:    Aspirin-Caffeine (BC FAST PAIN RELIEF PO), Take 1 packet by mouth every 8 (eight) hours as needed (headache)., Disp: , Rfl:    cyclobenzaprine (FLEXERIL) 10 MG tablet, Take 1 tablet (10 mg total) by mouth 2 (two) times daily as needed for muscle spasms., Disp: 20 tablet, Rfl: 0   HYDROcodone-acetaminophen (NORCO/VICODIN) 5-325 MG tablet, Take 1 tablet by mouth every 6 (six)  hours as needed for severe pain., Disp: 12 tablet, Rfl: 0   ibuprofen (ADVIL) 800 MG tablet, Take 1 tablet (800 mg total) by mouth every 8 (eight) hours as needed., Disp: 21 tablet, Rfl: 0  Observations/Objective: Patient is well-developed, well-nourished in no acute distress.  Resting comfortably  at home.  Head is normocephalic, atraumatic.  No labored breathing.  Speech is clear and coherent with logical content.  Patient is alert and oriented at baseline.  Decreased ROM of left elbow- unable to pronate hand at all.  Assessment and Plan:  Stephens Shire in today with chief complaint of Arm Pain   1. Elbow injury, left, initial encounter Needs to go to urgent care for xray Keep immoble until can get xray Motrin or tylenol    Follow Up Instructions: I discussed the assessment and treatment plan with the patient. The patient was provided an opportunity to ask questions and all were answered. The patient agreed with the plan and demonstrated an understanding of the instructions.  A copy of instructions were sent to the patient via MyChart.  The patient was advised to call back or seek an in-person evaluation if the symptoms worsen or if the condition fails to improve as anticipated.  Time:  I spent 8 minutes with the patient via telehealth technology discussing the above problems/concerns.    Mary-Margaret Hassell Done, FNP

## 2021-06-13 ENCOUNTER — Ambulatory Visit (HOSPITAL_COMMUNITY)
Admission: EM | Admit: 2021-06-13 | Discharge: 2021-06-13 | Disposition: A | Discharge status: Home / Self Care | Payer: Medicaid Other | Attending: Family Medicine | Admitting: Family Medicine

## 2021-06-13 ENCOUNTER — Encounter (HOSPITAL_COMMUNITY): Payer: Self-pay | Admitting: *Deleted

## 2021-06-13 ENCOUNTER — Ambulatory Visit (INDEPENDENT_AMBULATORY_CARE_PROVIDER_SITE_OTHER): Payer: Medicaid Other

## 2021-06-13 ENCOUNTER — Other Ambulatory Visit: Payer: Self-pay

## 2021-06-13 ENCOUNTER — Ambulatory Visit (HOSPITAL_COMMUNITY): Payer: Medicaid Other

## 2021-06-13 DIAGNOSIS — W109XXA Fall (on) (from) unspecified stairs and steps, initial encounter: Secondary | ICD-10-CM | POA: Diagnosis not present

## 2021-06-13 DIAGNOSIS — M25522 Pain in left elbow: Secondary | ICD-10-CM

## 2021-06-13 MED ORDER — DICLOFENAC SODIUM 75 MG PO TBEC: 75.0000 mg | DELAYED_RELEASE_TABLET | Freq: Two times a day (BID) | ORAL | 0 refills | Status: DC

## 2021-06-13 NOTE — ED Triage Notes (Signed)
Pt reports she fell down stairs last Sat and again on Monday. Pt reports Lt arm pain from fall. Pt reports she can not raise her arm above her head due to pain.

## 2021-06-13 NOTE — ED Notes (Signed)
Pt wants a definitive answer for what is wrong with her arm. Pt crying in room . This Clinical research associate asked Provider to review with Pt dignosis.

## 2021-06-13 NOTE — ED Notes (Signed)
Sling at bedside.

## 2021-06-13 NOTE — Discharge Instructions (Signed)
Wear your sling until your follow up with orthopaedics.

## 2021-06-15 NOTE — ED Provider Notes (Signed)
[facility]   [id number] 06/13/21 Arrival Time: 1607  ASSESSMENT & PLAN:  1. Left elbow pain    I have personally viewed the imaging studies ordered this visit. No fracture appreciated.  Placed in sling for comfort. Given her difficulty in fully extending arm at elbow, I recommend orthopaedic evaluation. May need to r/o occult radial head fracture. Neurologically intact. She voices understanding. Work note provided.  Discharge Medication List as of 06/13/2021  6:07 PM     START taking these medications   Details  diclofenac (VOLTAREN) 75 MG EC tablet Take 1 tablet (75 mg total) by mouth 2 (two) times daily., Starting Sat 06/13/2021, Normal        Orders Placed This Encounter  Procedures   DG Elbow Complete Left   Apply Shoulder Immobilizer/Sling    Recommend:  Follow-up Information     Schedule an appointment as soon as possible for a visit  with Haddix, Gillie Manners, MD.   Specialty: Orthopedic Surgery Contact information: [address] [place] [address] [phone]                 Reviewed expectations re: course of current medical issues. Questions answered. Outlined signs and symptoms indicating need for more acute intervention. Patient verbalized understanding. After Visit Summary given.  SUBJECTIVE: History from: patient. Teresa Burns is a 35 y.o. female who reports persistent moderate pain of her left elbow; described as aching; without radiation. Onset: abrupt. First noted: a week ago. Injury/trama: reports fall on stairs with immediate pain. Symptoms have gradually worsened since beginning. Aggravating factors: certain movements. Reports inability to fully extend L arm. Alleviating factors: rest. Associated symptoms: none reported. Extremity sensation changes or weakness: none. Self treatment: acetaminophen, with minimal relief.  History of similar: no.  History reviewed. No pertinent surgical history.     OBJECTIVE:  Vitals:   06/13/21 1717  BP: 122/77  Pulse: 71  Resp: 18  Temp: 98.4 F (36.9 C)  SpO2: 98%    General appearance: alert; no distress HEENT: [place]; AT Neck: supple with FROM Resp: unlabored respirations Extremities: LUE: warm with well perfused appearance; poorly localized moderate tenderness over left lateral elbow; without gross deformities; swelling: minimal; bruising: none; ROM: decreased CV: brisk extremity capillary refill of LUE; 2+ radial pulse of LUE. Skin: warm and dry; no visible rashes Neurologic: gait normal; normal sensation and strength of LUE Psychological: alert and cooperative; normal mood and affect  Imaging: DG Elbow Complete Left  Result Date: 06/13/2021 CLINICAL DATA:  Larey Seat downstairs, limited range of motion EXAM: LEFT ELBOW - COMPLETE 3+ VIEW COMPARISON:  06/19/2012 FINDINGS: Frontal, bilateral oblique, and lateral views of the left elbow are obtained. No acute fracture, subluxation, or dislocation. Joint spaces are well preserved. No joint effusion. Soft tissues are normal. IMPRESSION: 1. Unremarkable left elbow. Electronically Signed   By: Sharlet Salina M.D.   On: 06/13/2021 17:56       No Known Allergies  Past Medical History:  Diagnosis Date   Herpes    Sickle cell trait (HCC)    Social History   Socioeconomic History   Marital status: Single    Spouse name: Not on file   Number of children: Not on file   Years of education: Not on file   Highest education level: Not on file  Occupational History   Not on file  Tobacco Use   Smoking status: Some Days    Packs/day: 0.50    Types: Cigarettes  Smokeless tobacco: Never   Tobacco comments:    1/2PPD  Substance and Sexual Activity   Alcohol use: Yes    Alcohol/week: 0.0 standard drinks    Comment: pt states 8 to 10 shots nightly   Drug use: Yes    Types: Marijuana   Sexual activity: Not on file  Other Topics Concern   Not on file  Social History Narrative   Not on  file   Social Determinants of Health   Financial Resource Strain: Not on file  Food Insecurity: Not on file  Transportation Needs: Not on file  Physical Activity: Not on file  Stress: Not on file  Social Connections: Not on file   Family History  Problem Relation Age of Onset   Cancer Maternal Grandmother    History reviewed. No pertinent surgical history.     Mardella Layman, MD 06/15/21 [phone]

## 2021-07-13 ENCOUNTER — Ambulatory Visit (HOSPITAL_COMMUNITY)
Admission: EM | Admit: 2021-07-13 | Discharge: 2021-07-13 | Disposition: A | Discharge status: Home / Self Care | Payer: Medicaid Other | Attending: Family Medicine | Admitting: Family Medicine

## 2021-07-13 ENCOUNTER — Encounter (HOSPITAL_COMMUNITY): Payer: Self-pay

## 2021-07-13 ENCOUNTER — Other Ambulatory Visit: Payer: Self-pay

## 2021-07-13 DIAGNOSIS — L0231 Cutaneous abscess of buttock: Secondary | ICD-10-CM | POA: Diagnosis not present

## 2021-07-13 MED ORDER — DOXYCYCLINE HYCLATE 100 MG PO CAPS: 100.0000 mg | ORAL_CAPSULE | Freq: Two times a day (BID) | ORAL | 0 refills | Status: DC

## 2021-07-13 MED ORDER — LIDOCAINE HCL (PF) 1 % IJ SOLN
INTRAMUSCULAR | Status: AC
  Filled 2021-07-13: qty 2

## 2021-07-13 NOTE — Medical Student Note (Signed)
[facility] Statistician Note For educational purposes for Medical, PA and NP students only and not part of the legal medical record.   CSN: [id number] Arrival date & time: 07/13/21  1053      History   Chief Complaint Chief Complaint  Patient presents with   Abscess    HPI Teresa Burns is a 36 y.o. female.  -Pt states she has a hx of developing abscesses in her armpit and buttocks area (has been going on intermittently for 1 year)  -Today has a worsening abscess around her left buttocks region onset 1 weeks ago as well as an abscess under her right armpit  -Describes abscess as red, hot, and tender. Denies discharge, itchiness, fevers, or chills.  -Standing upright and sitting makes it worse. Rates pain severity 8-9/10  -She has tried alcohol rub, warm compress, cold water with no relief.  -Wears sweat pants and admits to being in sweaty clothes for periods of time due to dancing.     Abscess  Past Medical History:  Diagnosis Date   Herpes    Sickle cell trait [facility])     Patient Active Problem List   Diagnosis Date Noted   Encounter for general counseling on prescription of oral contraceptives 10/21/2015   Pleuritic chest pain 07/15/2015   Herpes genitalis 07/15/2015    History reviewed. No pertinent surgical history.  OB History   No obstetric history on file.      Home Medications    Prior to Admission medications   Medication Sig Start Date End Date Taking? Authorizing Provider  acetaminophen (TYLENOL) 500 MG tablet Take 1,000 mg by mouth every 6 (six) hours as needed for headache.    [provider]  aspirin-acetaminophen-caffeine (EXCEDRIN MIGRAINE) [phone] MG tablet Take 2 tablets by mouth every 6 (six) hours as needed for headache.    [provider]  Aspirin-Caffeine (BC FAST PAIN RELIEF PO) Take 1 packet by mouth every 8 (eight) hours as needed (headache).    [provider]  diclofenac (VOLTAREN) 75  MG EC tablet Take 1 tablet (75 mg total) by mouth 2 (two) times daily. 06/13/21   Vanessa Kick, MD    Family History Family History  Problem Relation Age of Onset   Cancer Maternal Grandmother     Social History Social History   Tobacco Use   Smoking status: Some Days    Packs/day: 0.50    Types: Cigarettes   Smokeless tobacco: Never   Tobacco comments:    1/2PPD  Substance Use Topics   Alcohol use: Yes    Alcohol/week: 0.0 standard drinks    Comment: pt states 8 to 10 shots nightly   Drug use: Yes    Types: Marijuana     Allergies   Patient has no known allergies.   Review of Systems Review of Systems As noted in HPI    Physical Exam Updated Vital Signs BP 124/83 (BP Location: Left Arm)    Pulse 78    Temp 98.3 F (36.8 C) (Oral)    Resp 18    LMP 07/13/2021    SpO2 97%   Physical Exam Skin:    Comments: Tenderness on palpation of 1 cm erythematous abscess located in right armpit and medial superior left buttock     ED Treatments / Results  Labs (all labs ordered are listed, but only abnormal results are displayed) Labs Reviewed - No data to display  EKG  Radiology No results found.  Procedures Procedures (including critical care time)  Medications Ordered in ED Medications - No data to display   Initial Impression / Assessment and Plan / ED Course  I have reviewed the triage vital signs and the nursing notes.  Pertinent labs & imaging results that were available during my care of the patient were reviewed by me and considered in my medical decision making (see chart for details).      Final Clinical Impressions(s) / ED Diagnoses   Hidradenitis suppurativa - Incision and drainage done on abscess of left buttock.  -Prescribed an antibiotic today -Advised patient to avoid antiperspirant deodorant and to try soaking in sitz bath    New Prescriptions New Prescriptions   No medications on file

## 2021-07-13 NOTE — Discharge Instructions (Signed)
If not allergic, you may use over the counter ibuprofen or acetaminophen as needed for pain. 

## 2021-07-13 NOTE — ED Triage Notes (Signed)
Pct presents with abscess on buttocks X 1 week; pt states she has been doing warm baths with no relief.

## 2021-07-15 NOTE — ED Provider Notes (Signed)
°  [facility]   [id number] 07/13/21 Arrival Time: 1053  ASSESSMENT & PLAN:  1. Abscess of buttock, left     Incision and Drainage Procedure Note  Anesthesia: 1% plain lidocaine  Procedure Details  The procedure, risks and complications have been discussed in detail (including, but not limited to pain and bleeding) with the patient.  The skin induration was prepped and draped in the usual fashion. After adequate local anesthesia, I&D with a #11 blade was performed on the right mid buttock with purulent drainage.  EBL: minimal Drains: none Packing: n/a Condition: Tolerated procedure well Complications: none.  Meds ordered this encounter  Medications   doxycycline (VIBRAMYCIN) 100 MG capsule    Sig: Take 1 capsule (100 mg total) by mouth 2 (two) times daily.    Dispense:  14 capsule    Refill:  0     Wound care instructions discussed and given in written format. To return in 48 hours for wound check.  Finish all antibiotics. OTC analgesics as needed.  Reviewed expectations re: course of current medical issues. Questions answered. Outlined signs and symptoms indicating need for more acute intervention. Patient verbalized understanding. After Visit Summary given.   SUBJECTIVE:  Teresa Burns is a 36 y.o. female who presents with a possible infection of her R buttock. Onset gradual, approximately a few days ago without active drainage and without active bleeding. Symptoms have gradually worsened since beginning; increased pain. Fever: absent. OTC/home treatment: none.   OBJECTIVE:  Vitals:   07/13/21 1225  BP: 124/83  Pulse: 78  Resp: 18  Temp: 98.3 F (36.8 C)  TempSrc: Oral  SpO2: 97%    General appearance: alert; no distress R mid buttock: with approx 1.0 cm induration with mild overlying erythema; tender to touch; no active drainage or bleeding Psychological: alert and cooperative; normal mood and affect  No Known Allergies  Past Medical  History:  Diagnosis Date   Herpes    Sickle cell trait (HCC)    Social History   Socioeconomic History   Marital status: Single    Spouse name: Not on file   Number of children: Not on file   Years of education: Not on file   Highest education level: Not on file  Occupational History   Not on file  Tobacco Use   Smoking status: Some Days    Packs/day: 0.50    Types: Cigarettes   Smokeless tobacco: Never   Tobacco comments:    1/2PPD  Substance and Sexual Activity   Alcohol use: Yes    Alcohol/week: 0.0 standard drinks    Comment: pt states 8 to 10 shots nightly   Drug use: Yes    Types: Marijuana   Sexual activity: Not on file  Other Topics Concern   Not on file  Social History Narrative   Not on file   Social Determinants of Health   Financial Resource Strain: Not on file  Food Insecurity: Not on file  Transportation Needs: Not on file  Physical Activity: Not on file  Stress: Not on file  Social Connections: Not on file   Family History  Problem Relation Age of Onset   Cancer Maternal Grandmother    History reviewed. No pertinent surgical history.          Mardella Layman, MD 07/15/21 [phone]

## 2022-05-25 ENCOUNTER — Inpatient Hospital Stay (HOSPITAL_COMMUNITY)
Admission: AD | Admit: 2022-05-25 | Discharge: 2022-05-25 | Disposition: A | Discharge status: Home / Self Care | Payer: Medicaid Other | Attending: Obstetrics & Gynecology | Admitting: Obstetrics & Gynecology

## 2022-05-25 ENCOUNTER — Encounter (HOSPITAL_COMMUNITY): Payer: Self-pay | Admitting: *Deleted

## 2022-05-25 DIAGNOSIS — J111 Influenza due to unidentified influenza virus with other respiratory manifestations: Secondary | ICD-10-CM

## 2022-05-25 DIAGNOSIS — R112 Nausea with vomiting, unspecified: Secondary | ICD-10-CM | POA: Insufficient documentation

## 2022-05-25 DIAGNOSIS — Z1152 Encounter for screening for COVID-19: Secondary | ICD-10-CM | POA: Diagnosis not present

## 2022-05-25 DIAGNOSIS — Z3201 Encounter for pregnancy test, result positive: Secondary | ICD-10-CM | POA: Diagnosis present

## 2022-05-25 LAB — COMPREHENSIVE METABOLIC PANEL
ALT: 64 U/L — ABNORMAL HIGH (ref 0–44)
AST: 48 U/L — ABNORMAL HIGH (ref 15–41)
Albumin: 3.8 g/dL (ref 3.5–5.0)
Alkaline Phosphatase: 41 U/L (ref 38–126)
Anion gap: 9 (ref 5–15)
BUN: 6 mg/dL (ref 6–20)
CO2: 24 mmol/L (ref 22–32)
Calcium: 8.9 mg/dL (ref 8.9–10.3)
Chloride: 103 mmol/L (ref 98–111)
Creatinine, Ser: 0.49 mg/dL (ref 0.44–1.00)
GFR, Estimated: >60 mL/min (ref >60)
Glucose, Bld: 94 mg/dL (ref 70–99)
Potassium: 3 mmol/L — ABNORMAL LOW (ref 3.5–5.1)
Sodium: 136 mmol/L (ref 135–145)
Total Bilirubin: 0.8 mg/dL (ref 0.3–1.2)
Total Protein: 7.4 g/dL (ref 6.5–8.1)

## 2022-05-25 LAB — URINALYSIS, ROUTINE W REFLEX MICROSCOPIC
Bacteria, UA: NONE SEEN
Bilirubin Urine: NEGATIVE
Glucose, UA: NEGATIVE mg/dL
Ketones, ur: NEGATIVE mg/dL
Nitrite: NEGATIVE
Protein, ur: NEGATIVE mg/dL
Specific Gravity, Urine: 1.01 (ref 1.005–1.030)
pH: 6 (ref 5.0–8.0)

## 2022-05-25 LAB — RESP PANEL BY RT-PCR (RSV, FLU A&B, COVID)  RVPGX2
Influenza A by PCR: NEGATIVE
Influenza B by PCR: NEGATIVE
Resp Syncytial Virus by PCR: NEGATIVE
SARS Coronavirus 2 by RT PCR: NEGATIVE

## 2022-05-25 LAB — POCT PREGNANCY, URINE: Preg Test, Ur: POSITIVE — AB

## 2022-05-25 LAB — AMYLASE: Amylase: 146 U/L — ABNORMAL HIGH (ref 28–100)

## 2022-05-25 LAB — LIPASE, BLOOD: Lipase: 30 U/L (ref 11–51)

## 2022-05-25 MED ORDER — LACTATED RINGERS IV BOLUS
1000.0000 mL | Freq: Once | INTRAVENOUS | Status: AC
  Administered 2022-05-25: 1000 mL via INTRAVENOUS

## 2022-05-25 MED ORDER — ONDANSETRON HCL 4 MG/2ML IJ SOLN
4.0000 mg | Freq: Once | INTRAMUSCULAR | Status: AC
  Administered 2022-05-25: 4 mg via INTRAVENOUS
  Filled 2022-05-25: qty 2

## 2022-05-25 MED ORDER — POTASSIUM CHLORIDE CRYS ER 20 MEQ PO TBCR
40.0000 meq | EXTENDED_RELEASE_TABLET | Freq: Once | ORAL | Status: AC
  Administered 2022-05-25: 40 meq via ORAL
  Filled 2022-05-25: qty 2

## 2022-05-25 MED ORDER — LACTATED RINGERS IV BOLUS
500.0000 mL | Freq: Once | INTRAVENOUS | Status: AC
  Administered 2022-05-25: 500 mL via INTRAVENOUS

## 2022-05-25 MED ORDER — ONDANSETRON 4 MG PO TBDP: 4.0000 mg | ORAL_TABLET | Freq: Three times a day (TID) | ORAL | 0 refills | Status: DC | PRN

## 2022-05-25 NOTE — MAU Note (Signed)
Teresa Burns is a 36 y.o. at Unknown here in MAU reporting: started coughing last Tues, knew it was more than that.  Has been throwing up since then. This morning and last night  has been unable to keep anything down.  Is feeling so weak.  Has lost 6 # since seen on Thursday. ( When she found out flu and preg). Reports fever 99.2.  States is not hurting any place, just wants to feel better.  LMP: 11/10 Onset of complaint: last Tues Pain score: no Vitals:   05/25/22 1446  BP: 117/82  Pulse: 84  Resp: 20  Temp: 98.7 F (37.1 C)  SpO2: 100%      Lab orders placed from triage:    Was given tamiflu and prednisone.  Fyi: has sickle cell  +flu a, "everything else was neg

## 2022-05-25 NOTE — Discharge Instructions (Signed)
It was great taking care of you today.  I am glad that the fluids helped you feel better.  I will send a prescription for some Zofran to your pharmacy which she can take as needed.  If your symptoms worsen I would like for you to return for further evaluation.  Below is a list of medications that are safe to take during pregnancy.  I sent a message to our clinic to have you set up for an initial prenatal visit.  If you have any questions or concerns please return for further evaluation.  I hope you have a great night!  Safe Medications in Pregnancy   Acne:  Benzoyl Peroxide  Salicylic Acid   Backache/Headache:  Tylenol: 2 regular strength every 4 hours OR               2 Extra strength every 6 hours   Colds/Coughs/Allergies:  Benadryl (alcohol free) 25 mg every 6 hours as needed  Breath right strips  Claritin  Cepacol throat lozenges  Chloraseptic throat spray  Cold-Eeze- up to three times per day  Cough drops, alcohol free  Flonase (by prescription only)  Guaifenesin  Mucinex  Robitussin DM (plain only, alcohol free)  Saline nasal spray/drops  Sudafed (pseudoephedrine) & Actifed * use only after [redacted] weeks gestation and if you do not have high blood pressure  Tylenol  Vicks Vaporub  Zinc lozenges  Zyrtec   Constipation:  Colace  Ducolax suppositories  Fleet enema  Glycerin suppositories  Metamucil  Milk of magnesia  Miralax  Senokot  Smooth move tea   Diarrhea:  Kaopectate  Imodium A-D   *NO pepto Bismol   Hemorrhoids:  Anusol  Anusol HC  Preparation H  Tucks   Indigestion:  Tums  Maalox  Mylanta  Zantac  Pepcid   Insomnia:  Benadryl (alcohol free) 25mg  every 6 hours as needed  Tylenol PM  Unisom, no Gelcaps   Leg Cramps:  Tums  MagGel   Nausea/Vomiting:  Bonine  Dramamine  Emetrol  Ginger extract  Sea bands  Meclizine  Nausea medication to take during pregnancy:  Unisom (doxylamine succinate 25 mg tablets) Take one tablet daily at bedtime.  If symptoms are not adequately controlled, the dose can be increased to a maximum recommended dose of two tablets daily (1/2 tablet in the morning, 1/2 tablet mid-afternoon and one at bedtime).  Vitamin B6 100mg  tablets. Take one tablet twice a day (up to 200 mg per day).   Skin Rashes:  Aveeno products  Benadryl cream or 25mg  every 6 hours as needed  Calamine Lotion  1% cortisone cream   Yeast infection:  Gyne-lotrimin 7  Monistat 7    **If taking multiple medications, please check labels to avoid duplicating the same active ingredients  **take medication as directed on the label  ** Do not exceed 4000 mg of tylenol in 24 hours  **Do not take medications that contain aspirin or ibuprofen

## 2022-05-25 NOTE — MAU Provider Note (Signed)
History     CSN: [id number]  Arrival date and time: 05/25/22 1416   None     Chief Complaint  Patient presents with   Emesis   Cough   Patient presenting for evaluation due to flu symptoms.  She reports that she had flu symptoms starting 6 days ago and was seen and diagnosed with the flu.  She was treated with Tamiflu.  She reports for the last 2 days she has been extremely nauseous and has lost 6 pounds.  She has been unable to keep anything down by mouth.  She has not been taking any medications for this.  Continues to have body aches as well.  Is afebrile today.  Denies any vaginal bleeding, contractions.  Is unsure about dating and has yet to establish with OB provider.    OB History     Gravida  1   Para      Term      Preterm      AB      Living         SAB      IAB      Ectopic      Multiple      Live Births              Past Medical History:  Diagnosis Date   Herpes    Sickle cell trait (HCC)     No past surgical history on file.  Family History  Problem Relation Age of Onset   Cancer Maternal Grandmother     Social History   Tobacco Use   Smoking status: Some Days    Packs/day: 0.50    Types: Cigarettes   Smokeless tobacco: Never   Tobacco comments:    1/2PPD  Substance Use Topics   Alcohol use: Yes    Alcohol/week: 0.0 standard drinks of alcohol    Comment: pt states 8 to 10 shots nightly   Drug use: Yes    Types: Marijuana    Allergies: No Known Allergies  Medications Prior to Admission  Medication Sig Dispense Refill Last Dose   acetaminophen (TYLENOL) 500 MG tablet Take 1,000 mg by mouth every 6 (six) hours as needed for headache.      aspirin-acetaminophen-caffeine (EXCEDRIN MIGRAINE) 250-250-65 MG tablet Take 2 tablets by mouth every 6 (six) hours as needed for headache.      Aspirin-Caffeine (BC FAST PAIN RELIEF PO) Take 1 packet by mouth every 8 (eight) hours as needed (headache).      diclofenac (VOLTAREN) 75 MG  EC tablet Take 1 tablet (75 mg total) by mouth 2 (two) times daily. 14 tablet 0    doxycycline (VIBRAMYCIN) 100 MG capsule Take 1 capsule (100 mg total) by mouth 2 (two) times daily. 14 capsule 0     Review of Systems  Constitutional:  Positive for chills and fever.  HENT:  Positive for congestion.   Eyes:  Negative for visual disturbance.  Respiratory:  Positive for cough.   Gastrointestinal:  Positive for nausea and vomiting.  Genitourinary:  Negative for vaginal bleeding, vaginal discharge and vaginal pain.  Musculoskeletal:  Positive for back pain.  Neurological:  Positive for headaches.   Physical Exam   Blood pressure 117/82, pulse 84, temperature 98.7 F (37.1 C), temperature source Oral, resp. rate 20, height 5' (1.524 m), weight 61.8 kg, last menstrual period 04/09/2022, SpO2 100 %.  Physical Exam Constitutional:      Appearance: She is ill-appearing.  HENT:  Head: Normocephalic and atraumatic.     Nose: Nose normal.     Mouth/Throat:     Mouth: Mucous membranes are moist.  Eyes:     Pupils: Pupils are equal, round, and reactive to light.  Cardiovascular:     Rate and Rhythm: Normal rate.     Pulses: Normal pulses.  Pulmonary:     Effort: Pulmonary effort is normal.  Abdominal:     General: There is no distension.     Palpations: Abdomen is soft.  Musculoskeletal:     Cervical back: Normal range of motion.  Skin:    General: Skin is warm.     Capillary Refill: Capillary refill takes less than 2 seconds.  Neurological:     General: No focal deficit present.     Mental Status: She is alert.  Psychiatric:        Mood and Affect: Mood normal.     MAU Course  Procedures  MDM ODT Zofran IV fluid bolus CMP  Assessment and Plan  36 year old G2, P1 presenting with flulike symptoms.  Reports positive flu test 6 days ago and has received Tamiflu.  Symptoms worse over the past 2 days with decreased p.o. intake, increased nausea, fatigue.  Flu  symptoms Patient given fluid bolus with great improvement.  Also given Zofran with improvement in the nausea.  Patient reevaluated and reports she feels much better.  Additional 500 mL fluid bolus given.  CMP came back with elevated LFTs so amylase and lipase were checked.  Lipase within normal limits and amylase mildly elevated.  Likely due to dehydration as well as vomiting.  Encouraged symptomatic management including adequate hydration, Tylenol for fever or discomfort.  Prescription sent for ODT Zofran to patient's pharmacy.  Patient discharged home with strict return precautions.  Need for prenatal care provider Message sent to [facility] for establish care visit.   Teresa Burns 05/25/2022, 5:33 PM

## 2022-05-31 NOTE — L&D Delivery Note (Addendum)
OB/GYN Faculty Practice Delivery Note  Teresa Burns is a 37 y.o. G2P1001 s/p VD at [redacted]w[redacted]d. She was admitted for SROM.   ROM: 16h 54m with clear fluid GBS Status: Negative/-- (07/22 0935) Maximum Maternal Temperature: 98.6 F  Labor Progress: Initial SVE: 3/50/-3. She then progressed to complete.   Delivery Date/Time: 01/12/23 - 0152 Delivery: Called to room and patient was complete and pushing. Head delivered ROA. Nuchal cord x1 present, unable to reduce at perineum, delivered through with ease. Shoulder and body delivered in usual fashion. Infant with spontaneous cry, placed on mother's abdomen, dried and stimulated. Cord clamped x 2 after 1-minute delay, and cut by Father. Cord blood drawn. Placenta delivered spontaneously with gentle cord traction. Fundus firm with massage and Pitocin. Labia, perineum, vagina, and cervix inspected with 1st degree perineal repaired with 2-0 monocryl.  Baby Weight: pending  Placenta: 3 vessel, intact. Sent to L&D Complications: None Lacerations: 1st degree perineal EBL: 113 mL Analgesia: Epidural   Infant:  APGAR (1 MIN): 8 APGAR (5 MINS): 9  Teresa Millers, DO PGY-3 Family Medicine 01/12/2023, 2:16 AM   Fellow ATTESTATION  I was present and gloved for this delivery and agree with the above documentation in the resident's note except as below.   Celedonio Savage, MD [facility] [employer] [facility] (Faculty Practice) 01/12/2023, 2:19 AM

## 2022-06-08 ENCOUNTER — Ambulatory Visit (INDEPENDENT_AMBULATORY_CARE_PROVIDER_SITE_OTHER): Payer: Medicaid Other

## 2022-06-08 ENCOUNTER — Ambulatory Visit: Payer: Medicaid Other | Admitting: *Deleted

## 2022-06-08 VITALS — BP 123/85 | HR 80 | Ht 61.0 in | Wt 133.8 lb

## 2022-06-08 DIAGNOSIS — Z3480 Encounter for supervision of other normal pregnancy, unspecified trimester: Secondary | ICD-10-CM

## 2022-06-08 DIAGNOSIS — O3680X0 Pregnancy with inconclusive fetal viability, not applicable or unspecified: Secondary | ICD-10-CM | POA: Diagnosis not present

## 2022-06-08 DIAGNOSIS — Z3A08 8 weeks gestation of pregnancy: Secondary | ICD-10-CM | POA: Diagnosis not present

## 2022-06-08 HISTORY — DX: Encounter for supervision of other normal pregnancy, unspecified trimester: Z34.80

## 2022-06-08 MED ORDER — BLOOD PRESSURE KIT DEVI: 1.0000 | 0 refills | Status: DC

## 2022-06-08 NOTE — Progress Notes (Signed)
New OB Intake  I connected withNAME@  on 06/08/22 at  3:10 PM EST by In Person Visit and verified that I am speaking with the correct person using two identifiers. Nurse is located at [facility] and pt is located at [place].  I discussed the limitations, risks, security and privacy concerns of performing an evaluation and management service by telephone and the availability of in person appointments. I also discussed with the patient that there may be a patient responsible charge related to this service. The patient expressed understanding and agreed to proceed.  I explained I am completing New OB Intake today. We discussed EDD of 01/14/23 that is based on LMP of 04/09/22. Pt is G2/P1. I reviewed her allergies, medications, Medical/Surgical/OB history, and appropriate screenings. I informed her of [facility] services. [facility] information placed in AVS. Based on history, this is a low risk pregnancy.  Patient Active Problem List   Diagnosis Date Noted   Encounter for general counseling on prescription of oral contraceptives 10/21/2015   Pleuritic chest pain 07/15/2015   Herpes genitalis 07/15/2015    Concerns addressed today  Delivery Plans Plans to deliver at [facility]. Patient given information for [facility] Healthy Baby website for more information about Women's and [place]. Patient is not interested in water birth. Offered upcoming OB visit with CNM to discuss further.  MyChart/Babyscripts MyChart access verified. I explained pt will have some visits in office and some virtually. Babyscripts instructions given and order placed. Patient verifies receipt of registration text/e-mail. Account successfully created and app downloaded.  Blood Pressure Cuff/Weight Scale Blood pressure cuff ordered for patient to pick-up from [employer]. Explained after first prenatal appt pt will check weekly and document in 15. Patient does not have weight scale; patient may purchase if they desire to track  weight weekly in Babyscripts.  Anatomy US Explained first scheduled [place] will be around 19 weeks. Anatomy US scheduled for 19 wks at MFM. Pt notified to arrive at [facility].  Labs Discussed Johnsie Cancel genetic screening with patient. Would like both Panorama and Horizon drawn at new OB visit. Routine prenatal labs needed.  COVID Vaccine Patient has not had COVID vaccine.   Social Determinants of Health Food Insecurity: Patient denies food insecurity. WIC Referral: Patient is interested in referral to [facility].  Transportation: Patient denies transportation needs. Childcare: Discussed no children allowed at ultrasound appointments. Offered childcare services; patient declines childcare services at this time.  First visit review I reviewed new OB appt with patient. I explained they will have a provider visit that includes pelvic exam and labs. Explained pt will be seen by Dr. Jodi Mourning at first visit; encounter routed to appropriate provider. Explained that patient will be seen by pregnancy navigator following visit with provider.   Penny Pia, RN 06/08/2022  3:14 PM

## 2022-06-09 LAB — CBC/D/PLT+RPR+RH+ABO+RUBIGG...
Antibody Screen: NEGATIVE
Basophils Absolute: 0 10*3/uL (ref 0.0–0.2)
Basos: 1 %
EOS (ABSOLUTE): 0 10*3/uL (ref 0.0–0.4)
Eos: 0 %
HCV Ab: NONREACTIVE
HIV Screen 4th Generation wRfx: NONREACTIVE
Hematocrit: 35.7 % (ref 34.0–46.6)
Hemoglobin: 12.5 g/dL (ref 11.1–15.9)
Hepatitis B Surface Ag: NEGATIVE
Immature Grans (Abs): 0 10*3/uL (ref 0.0–0.1)
Immature Granulocytes: 0 %
Lymphocytes Absolute: 1.5 10*3/uL (ref 0.7–3.1)
Lymphs: 28 %
MCH: 29.5 pg (ref 26.6–33.0)
MCHC: 35 g/dL (ref 31.5–35.7)
MCV: 84 fL (ref 79–97)
Monocytes Absolute: 0.8 10*3/uL (ref 0.1–0.9)
Monocytes: 14 %
Neutrophils Absolute: 3 10*3/uL (ref 1.4–7.0)
Neutrophils: 57 %
Platelets: 220 10*3/uL (ref 150–450)
RBC: 4.24 x10E6/uL (ref 3.77–5.28)
RDW: 12.3 % (ref 11.7–15.4)
RPR Ser Ql: NONREACTIVE
Rh Factor: POSITIVE
Rubella Antibodies, IGG: 1.68 index (ref >0.99)
WBC: 5.3 10*3/uL (ref 3.4–10.8)

## 2022-06-09 LAB — HCV INTERPRETATION

## 2022-06-14 ENCOUNTER — Other Ambulatory Visit: Payer: Self-pay | Admitting: *Deleted

## 2022-06-14 DIAGNOSIS — Z3480 Encounter for supervision of other normal pregnancy, unspecified trimester: Secondary | ICD-10-CM

## 2022-06-14 NOTE — Progress Notes (Signed)
U/s order changed to detail.

## 2022-07-02 ENCOUNTER — Encounter: Payer: Medicaid Other | Admitting: Obstetrics

## 2022-07-15 ENCOUNTER — Encounter: Payer: Self-pay | Admitting: Obstetrics

## 2022-07-15 ENCOUNTER — Other Ambulatory Visit (HOSPITAL_COMMUNITY)
Admission: RE | Admit: 2022-07-15 | Discharge: 2022-07-15 | Disposition: A | Discharge status: Home / Self Care | Payer: Medicaid Other | Source: Ambulatory Visit | Attending: Obstetrics | Admitting: Obstetrics

## 2022-07-15 ENCOUNTER — Ambulatory Visit (INDEPENDENT_AMBULATORY_CARE_PROVIDER_SITE_OTHER): Payer: Medicaid Other | Admitting: Obstetrics

## 2022-07-15 VITALS — BP 122/67 | HR 88 | Wt 131.0 lb

## 2022-07-15 DIAGNOSIS — F329 Major depressive disorder, single episode, unspecified: Secondary | ICD-10-CM | POA: Diagnosis not present

## 2022-07-15 DIAGNOSIS — Z3A13 13 weeks gestation of pregnancy: Secondary | ICD-10-CM

## 2022-07-15 DIAGNOSIS — O0990 Supervision of high risk pregnancy, unspecified, unspecified trimester: Secondary | ICD-10-CM

## 2022-07-15 DIAGNOSIS — O219 Vomiting of pregnancy, unspecified: Secondary | ICD-10-CM | POA: Diagnosis not present

## 2022-07-15 DIAGNOSIS — O09521 Supervision of elderly multigravida, first trimester: Secondary | ICD-10-CM

## 2022-07-15 DIAGNOSIS — Z3480 Encounter for supervision of other normal pregnancy, unspecified trimester: Secondary | ICD-10-CM | POA: Diagnosis not present

## 2022-07-15 DIAGNOSIS — O09529 Supervision of elderly multigravida, unspecified trimester: Secondary | ICD-10-CM

## 2022-07-15 DIAGNOSIS — O0991 Supervision of high risk pregnancy, unspecified, first trimester: Secondary | ICD-10-CM

## 2022-07-15 MED ORDER — DOXYLAMINE-PYRIDOXINE 10-10 MG PO TBEC: DELAYED_RELEASE_TABLET | ORAL | 5 refills | Status: DC

## 2022-07-15 NOTE — Progress Notes (Signed)
NOB in office, intake and u/s completed on 06/08/2022

## 2022-07-15 NOTE — Progress Notes (Signed)
Error

## 2022-07-15 NOTE — Progress Notes (Signed)
Subjective:    Teresa Burns is being seen today for her first obstetrical visit.  This is not a planned pregnancy. She is at 34w6dgestation. Her obstetrical history is significant for  none . Relationship with FOB: significant other, not living together. Patient does intend to breast feed. Pregnancy history fully reviewed.  The information documented in the HPI was reviewed and verified.  Menstrual History: OB History     Gravida  2   Para  1   Term  1   Preterm  0   AB  0   Living  1      SAB  0   IAB  0   Ectopic  0   Multiple  0   Live Births  1            Patient's last menstrual period was 04/09/2022.    Past Medical History:  Diagnosis Date   Herpes    Sickle cell trait ([facility]     History reviewed. No pertinent surgical history.  (Not in a hospital admission)  No Known Allergies  Social History   Tobacco Use   Smoking status: Former    Packs/day: 0.50    Types: Cigarettes    Quit date: 05/16/2022    Years since quitting: 0.1   Smokeless tobacco: Never   Tobacco comments:    1/2PPD  Substance Use Topics   Alcohol use: Yes    Alcohol/week: 0.0 standard drinks of alcohol    Comment: pt states 8 to 10 shots nightly    Family History  Problem Relation Age of Onset   Stroke Maternal Grandmother    Diabetes Maternal Grandmother    Hypertension Maternal Grandmother    Cancer Maternal Grandmother      Review of Systems Constitutional: negative for weight loss Gastrointestinal: negative for vomiting Genitourinary:negative for genital lesions and vaginal discharge and dysuria Musculoskeletal:negative for back pain Behavioral/Psych: negative for abusive relationship, depression, illegal drug usage and tobacco use    Objective:    BP 122/67   Pulse 88   Wt 131 lb (59.4 kg)   LMP 04/09/2022   BMI 24.75 kg/m  General Appearance:    Alert, cooperative, no distress, appears stated age  Head:    Normocephalic, without obvious  abnormality, atraumatic  Eyes:    PERRL, conjunctiva/corneas clear, EOM's intact, fundi    benign, both eyes  Ears:    Normal TM's and external ear canals, both ears  Nose:   Nares normal, septum midline, mucosa normal, no drainage    or sinus tenderness  Throat:   Lips, mucosa, and tongue normal; teeth and gums normal  Neck:   Supple, symmetrical, trachea midline, no adenopathy;    thyroid:  no enlargement/tenderness/nodules; no carotid   bruit or JVD  Back:     Symmetric, no curvature, ROM normal, no CVA tenderness  Lungs:     Clear to auscultation bilaterally, respirations unlabored  Chest Wall:    No tenderness or deformity   Heart:    Regular rate and rhythm, S1 and S2 normal, no murmur, rub   or gallop  Breast Exam:    No tenderness, masses, or nipple abnormality  Abdomen:     Soft, non-tender, bowel sounds active all four quadrants,    no masses, no organomegaly  Genitalia:    Normal female without lesion, discharge or tenderness  Extremities:   Extremities normal, atraumatic, no cyanosis or edema  Pulses:   2+ and symmetric all  extremities  Skin:   Skin color, texture, turgor normal, no rashes or lesions  Lymph nodes:   Cervical, supraclavicular, and axillary nodes normal  Neurologic:   CNII-XII intact, normal strength, sensation and reflexes    throughout      Lab Review Urine pregnancy test Labs reviewed yes Radiologic studies reviewed yes  Assessment:    Pregnancy at 19w6dweeks    Plan:   1. Supervision of high risk pregnancy, antepartum Rx: - Cytology - PAP( [place]) - Cervicovaginal ancillary only( [place])  2. Antepartum multigravida of advanced maternal age  37 Nausea and vomiting in pregnancy Rx: - Doxylamine-Pyridoxine (DICLEGIS) 10-10 MG TBEC; 1 tab in AM, 1 tab mid afternoon 2 tabs at bedtime. Max dose 4 tabs daily.  Dispense: 100 tablet; Refill: 5  4. Reactive depression (situational) Rx: - Ambulatory referral to [facility]   Prenatal vitamins.  Counseling provided regarding continued use of seat belts, cessation of alcohol consumption, smoking or use of illicit drugs; infection precautions i.e., influenza/TDAP immunizations, toxoplasmosis,CMV, parvovirus, listeria and varicella; workplace safety, exercise during pregnancy; routine dental care, safe medications, sexual activity, hot tubs, saunas, pools, travel, caffeine use, fish and methlymercury, potential toxins, hair treatments, varicose veins Weight gain recommendations per IOM guidelines reviewed: underweight/BMI< 18.5--> gain 28 - 40 lbs; normal weight/BMI 18.5 - 24.9--> gain 25 - 35 lbs; overweight/BMI 25 - 29.9--> gain 15 - 25 lbs; obese/BMI >30->gain  11 - 20 lbs Problem list reviewed and updated. FIRST/CF mutation testing/NIPT/QUAD SCREEN/fragile X/Ashkenazi Jewish population testing/Spinal muscular atrophy discussed: requested. Role of ultrasound in pregnancy discussed; fetal survey: requested. Amniocentesis discussed: not indicated. VBAC calculator score: VBAC consent form provided Meds ordered this encounter  Medications   Doxylamine-Pyridoxine (DICLEGIS) 10-10 MG TBEC    Sig: 1 tab in AM, 1 tab mid afternoon 2 tabs at bedtime. Max dose 4 tabs daily.    Dispense:  100 tablet    Refill:  5   Orders Placed This Encounter  Procedures   Panorama Prenatal Test Full Panel    ==========Department Information========== ID: [id number]:[facility] [id number]: [phone] Fax: [phone]    Order Specific Question:   Expected due date (MM/DD/YYYY):    Answer:   01/14/2023    Order Specific Question:   Is this a twin pregnancy? (viable, no vanished twin)    Answer:   No    Order Specific Question:   Is this a surrogate or egg donor pregnancy?    Answer:   No    Order Specific Question:   I want  fetal sex included in the report:    Answer:   Yes    Order Specific Question:   Maternal Weight (lbs):    Answer:   131    Order Specific Question:   Which Microdeletion Panel should be ordered?    Answer:   22q11.2 Deletion    Order Specific Question:   What type of billing?    Answer:   BProgramme researcher, broadcasting/film/videoQuestion:   By placing this electronic order I confirm the testing ordered herein is medically necessary and this patient has been informed of the details of the genetic test(s) ordered, including the risks, benefits, and alternatives, and has consented to testing.    Answer:  Yes    Order Specific Question:   Select an order diagnosis: For additional options refer to [email or web address]    Answer:   Encounter for supervision of normal first pregnancy in second trimester [679345]   HORIZON Custom    ==========Department Information========== ID: [id number] Department:[facility] [place] [facility] [place], [place] [address] Dept: [phone] Dept Fax: [phone]     Order Specific Question:   Specify the name or ID of a valid Horizon Custom Panel:    Answer:   HBasic    Order Specific Question:   Is patient pregnant?    Answer:   Yes    Order Specific Question:   Practice ensures that HIPAA consent is obtained and will make available to [facility] upon request?    Answer:   Yes    Order Specific Question:   By placing this electronic order I confirm the testing ordered herein is medically necessary and this patient has been informed of the details of the genetic test(s) ordered, including the risks, benefits, and alternatives, and has consented to testing.    Answer:   Yes    Order Specific Question:   What type of billing?    Answer:   [employer] Specific Question:   Select an order diagnosis: For additional options refer to  [email or web address]    Answer:   Encounter for supervision of other normal pregnancy in second trimester [[id number]]    Order Specific Question:   Tay-Sachs add-on test?    Answer:   No   Ambulatory referral to [place]    Referral Priority:   Routine    Referral Type:   Consultation    Referral Reason:   Specialty Services Required    Number of Visits Requested:   1    Follow up in 4 weeks.  I have spent a total of 25 minutes of face-to-face and non-face-to-face time, excluding clinical staff time, reviewing notes and preparing to see patient, ordering tests and/or medications, and counseling the patient.   Shelly Bombard, MD 07/15/2022 11:52 AM

## 2022-07-16 LAB — CERVICOVAGINAL ANCILLARY ONLY
Bacterial Vaginitis (gardnerella): POSITIVE — AB
Candida Glabrata: NEGATIVE
Candida Vaginitis: NEGATIVE
Chlamydia: NEGATIVE
Comment: NEGATIVE
Comment: NEGATIVE
Comment: NEGATIVE
Comment: NEGATIVE
Comment: NEGATIVE
Comment: NORMAL
Neisseria Gonorrhea: NEGATIVE
Trichomonas: NEGATIVE

## 2022-07-17 ENCOUNTER — Other Ambulatory Visit: Payer: Self-pay | Admitting: Obstetrics

## 2022-07-17 DIAGNOSIS — B9689 Other specified bacterial agents as the cause of diseases classified elsewhere: Secondary | ICD-10-CM

## 2022-07-17 MED ORDER — METRONIDAZOLE 500 MG PO TABS: 500.0000 mg | ORAL_TABLET | Freq: Two times a day (BID) | ORAL | 2 refills | Status: DC

## 2022-07-19 ENCOUNTER — Encounter (HOSPITAL_COMMUNITY): Payer: Self-pay | Admitting: Family Medicine

## 2022-07-19 ENCOUNTER — Inpatient Hospital Stay (HOSPITAL_COMMUNITY)
Admission: AD | Admit: 2022-07-19 | Discharge: 2022-07-19 | Disposition: A | Discharge status: Home / Self Care | Payer: Medicaid Other | Attending: Family Medicine | Admitting: Family Medicine

## 2022-07-19 DIAGNOSIS — O26892 Other specified pregnancy related conditions, second trimester: Secondary | ICD-10-CM | POA: Diagnosis not present

## 2022-07-19 DIAGNOSIS — Z3A14 14 weeks gestation of pregnancy: Secondary | ICD-10-CM

## 2022-07-19 DIAGNOSIS — G43109 Migraine with aura, not intractable, without status migrainosus: Secondary | ICD-10-CM | POA: Diagnosis not present

## 2022-07-19 DIAGNOSIS — Z3492 Encounter for supervision of normal pregnancy, unspecified, second trimester: Secondary | ICD-10-CM

## 2022-07-19 DIAGNOSIS — G43909 Migraine, unspecified, not intractable, without status migrainosus: Secondary | ICD-10-CM | POA: Diagnosis not present

## 2022-07-19 DIAGNOSIS — Z3480 Encounter for supervision of other normal pregnancy, unspecified trimester: Secondary | ICD-10-CM

## 2022-07-19 MED ORDER — SODIUM CHLORIDE 0.9 % IV SOLN: 250.0000 mg | Freq: Once | INTRAVENOUS | Status: DC

## 2022-07-19 MED ORDER — LACTATED RINGERS IV BOLUS
1000.0000 mL | Freq: Once | INTRAVENOUS | Status: AC
  Administered 2022-07-19: 1000 mL via INTRAVENOUS

## 2022-07-19 MED ORDER — ACETAMINOPHEN-CAFFEINE 500-65 MG PO TABS
2.0000 | ORAL_TABLET | Freq: Once | ORAL | Status: AC
  Administered 2022-07-19: 2 via ORAL
  Filled 2022-07-19: qty 2

## 2022-07-19 MED ORDER — CYCLOBENZAPRINE HCL 10 MG PO TABS: 10.0000 mg | ORAL_TABLET | Freq: Three times a day (TID) | ORAL | 1 refills | Status: DC | PRN

## 2022-07-19 MED ORDER — METOCLOPRAMIDE HCL 10 MG PO TABS: 10.0000 mg | ORAL_TABLET | Freq: Four times a day (QID) | ORAL | 1 refills | Status: DC | PRN

## 2022-07-19 MED ORDER — ACETAMINOPHEN 10 MG/ML IV SOLN
1000.0000 mg | Freq: Once | INTRAVENOUS | Status: DC
  Filled 2022-07-19: qty 100

## 2022-07-19 MED ORDER — ONDANSETRON HCL 4 MG/2ML IJ SOLN
4.0000 mg | Freq: Once | INTRAMUSCULAR | Status: AC
  Administered 2022-07-19: 4 mg via INTRAVENOUS
  Filled 2022-07-19: qty 2

## 2022-07-19 MED ORDER — MAG-OXIDE 200 MG PO TABS: 400.0000 mg | ORAL_TABLET | Freq: Every day | ORAL | 3 refills | Status: DC

## 2022-07-19 MED ORDER — SODIUM CHLORIDE 0.9 % IV SOLN
25.0000 mg | Freq: Once | INTRAVENOUS | Status: DC
  Filled 2022-07-19: qty 1

## 2022-07-19 NOTE — Discharge Instructions (Signed)
For prevention of migraines in pregnancy: -Magnesium, 468m by mouth, once daily -Vitamin B2, 4040mby mouth, once daily  For treatment of migraines in pregnancy: -take medication at the first sign of the pain of a headache, or the first sign of your aura -start with 100074mylenol (do not exceed 4000m30m Tylenol in 24hrs), with or without Reglan 10mg53m no relief after 1-2hours, can take Flexeril 10mg 37mheadache is severe and not relieved by the above, may take Fioricet, 1 tablet, no more than 3 days per month -Fioricet should only be used as a rescue medication, when absolutely necessary -if the above regimen does not resolve your headache at all, please come to MAU for additional treatment -if you take Fioricet, please be aware that this has Tylenol in it and will contribute to the 4,000mg o72mlenol that you are allowed to take per day

## 2022-07-19 NOTE — MAU Provider Note (Signed)
Chief Complaint:  Headache   Event Date/Time   First Provider Initiated Contact with Patient 07/19/22 1059     HPI: Teresa Burns is a 37 y.o. G2P1001 at 49w3dwho presents to maternity admissions reporting a severe migraine x3 days that includes light sensitivity and nausea with last episode of emesis overnight. Has a history of migraines, typically managed with meds she was told not to take in pregnancy. Denies vaginal bleeding or abdominal cramping, no other physical complaints.   Pregnancy Course: Receives care at [facility], prenatal records reviewed.  Past Medical History:  Diagnosis Date   Herpes    [place]    OB History  Gravida Para Term Preterm AB Living  '2 1 1 '$ 0 0 1  SAB IAB Ectopic Multiple Live Births  0 0 0 0 1    # Outcome Date GA Lbr Len/2nd Weight Sex Delivery Anes PTL Lv  2 Current           1 Term 11/24/07     Vag-Vacuum   LIV   History reviewed. No pertinent surgical history. Family History  Problem Relation Age of Onset   Stroke Maternal Grandmother    Diabetes Maternal Grandmother    Hypertension Maternal Grandmother    Cancer Maternal Grandmother    Social History   Tobacco Use   Smoking status: Former    Packs/day: 0.50    Types: Cigarettes    Quit date: 05/16/2022    Years since quitting: 0.1   Smokeless tobacco: Never   Tobacco comments:    1/2PPD  Vaping Use   Vaping Use: Never used  Substance Use Topics   Alcohol use: Not Currently    Comment: pt states 8 to 10 shots nightly   Drug use: Not Currently    Types: Marijuana   No Known Allergies No medications prior to admission.   I have reviewed patient's Past Medical Hx, Surgical Hx, Family Hx, Social Hx, medications and allergies.   ROS:  Pertinent items noted in HPI and remainder of comprehensive ROS otherwise negative.   Physical Exam   Date/Time Temp Pulse Resp BP SpO2  07/19/22 1435 -- 76 -- 113/70 --  07/19/22 1030 -- 81 -- 114/66 --  07/19/22 1015 98.5  F (36.9 C) 94 20 118/71 98 %   Constitutional: Well-developed, well-nourished female in no acute distress.  Cardiovascular: normal rate & rhythm, warm and well-perfused Respiratory: normal effort, no problems with respiration noted GI: Abd soft, non-tender, gravid appropriate for gestational age MS: Extremities nontender, no edema, normal ROM Neurologic: Alert and oriented x 4.  GU: no CVA tenderness Pelvic: exam deferred   FHT: 160  Labs: No results found for this or any previous visit (from the past 24 hour(s)).  Imaging:  No results found.  MAU Course: Orders Placed This Encounter  Procedures   Discharge patient   Meds ordered this encounter  Medications   DISCONTD: acetaminophen (OFIRMEV) IV 1,000 mg    Order Specific Question:   Is the patient UNABLE to take oral / enteral medications?    Answer:   Yes   DISCONTD: caffeine-sodium benzoate ADULT 250 mg in sodium chloride 0.9 % 250 mL IVPB   DISCONTD: promethazine (PHENERGAN) 25 mg in sodium chloride 0.9 % 1,000 mL infusion   ondansetron (ZOFRAN) injection 4 mg   lactated ringers bolus 1,000 mL   acetaminophen-caffeine (EXCEDRIN TENSION HEADACHE) 500-65 MG per tablet 2 tablet   DISCONTD: metoCLOPramide (REGLAN) 10 MG tablet  Sig: Take 1 tablet (10 mg total) by mouth every 6 (six) hours as needed (headache with flexeril).    Dispense:  30 tablet    Refill:  1    Order Specific Question:   Supervising Provider    Answer:   Donnamae Jude [[id number]]   cyclobenzaprine (FLEXERIL) 10 MG tablet    Sig: Take 1 tablet (10 mg total) by mouth every 8 (eight) hours as needed for muscle spasms.    Dispense:  30 tablet    Refill:  1    Order Specific Question:   Supervising Provider    Answer:   Merrily Pew   Magnesium Oxide -Mg Supplement (MAG-OXIDE) 200 MG TABS    Sig: Take 2 tablets (400 mg total) by mouth at bedtime. If that amount causes loose stools in the am, switch to '200mg'$  daily at bedtime.    Dispense:  60  tablet    Refill:  3    Order Specific Question:   Supervising Provider    Answer:   Donnamae Jude [[id number]]   metoCLOPramide (REGLAN) 10 MG tablet    Sig: Take 1 tablet (10 mg total) by mouth every 6 (six) hours as needed (headache with flexeril).    Dispense:  30 tablet    Refill:  1    Order Specific Question:   Supervising Provider    Answer:   Donnamae Jude [id number]   Has a history of migraines - given zofran, LR bolus and excedrin which eased headache down to a 4/10 which patient affirmed was enough to go home for rest. Requested something for headaches at home, discussed treatment, can refer to headache specialist if these become recurrent in pregnancy.  MDM: Moderate  Assessment: 1. Supervision of other normal pregnancy, antepartum   2. Migraine with aura and without status migrainosus, not intractable   3. [redacted] weeks gestation of pregnancy   4. Presence of fetal heart sounds in second trimester    Plan: Discharge home in stable condition with return precautions.     Follow-up [facility] Follow up.   Specialty: Obstetrics and Gynecology Why: as scheduled for ongoing prenatal care Contact information: [address], [place] [phone]                Allergies as of 07/19/2022   No Known Allergies      Medication List     STOP taking these medications    aspirin-acetaminophen-caffeine 250-250-65 MG tablet Commonly known as: EXCEDRIN MIGRAINE   BC FAST PAIN RELIEF PO   diclofenac 75 MG EC tablet Commonly known as: VOLTAREN   ondansetron 4 MG disintegrating tablet Commonly known as: ZOFRAN-ODT       TAKE these medications    acetaminophen 500 MG tablet Commonly known as: TYLENOL Take 1,000 mg by mouth every 6 (six) hours as needed for headache.   Blood Pressure Kit Devi 1 Device by Does not apply route once a week.   cyclobenzaprine 10 MG  tablet Commonly known as: FLEXERIL Take 1 tablet (10 mg total) by mouth every 8 (eight) hours as needed for muscle spasms.   Doxylamine-Pyridoxine 10-10 MG Tbec Commonly known as: Diclegis 1 tab in AM, 1 tab mid afternoon 2 tabs at bedtime. Max dose 4 tabs daily.   Mag-Oxide 200 MG Tabs Generic drug: Magnesium Oxide -Mg Supplement Take 2 tablets (400 mg total) by  mouth at bedtime. If that amount causes loose stools in the am, switch to '200mg'$  daily at bedtime.   metoCLOPramide 10 MG tablet Commonly known as: Reglan Take 1 tablet (10 mg total) by mouth every 6 (six) hours as needed (headache with flexeril).   metroNIDAZOLE 500 MG tablet Commonly known as: FLAGYL Take 1 tablet (500 mg total) by mouth 2 (two) times daily.       ASK your doctor about these medications    doxycycline 100 MG capsule Commonly known as: VIBRAMYCIN Take 1 capsule (100 mg total) by mouth 2 (two) times daily.        Gaylan Gerold, CNM, MSN, [place] Certified Nurse Midwife, [facility]

## 2022-07-19 NOTE — MAU Note (Signed)
Teresa Burns is a 37 y.o. at 63w3dhere in MAU reporting: migraine started on Friday evening.  Getting worse.   Through up last night.  Been taking Tylenol, no relief.  Is light sensitive. No OB complaints Onset of complaint: Fri Pain score: 10 Vitals:   07/19/22 1015  BP: 118/71  Pulse: 94  Resp: 20  Temp: 98.5 F (36.9 C)  SpO2: 98%     FHT:160 Lab orders placed from triage:  UA collected

## 2022-07-20 ENCOUNTER — Encounter: Payer: Self-pay | Admitting: Emergency Medicine

## 2022-07-20 ENCOUNTER — Other Ambulatory Visit: Payer: Self-pay | Admitting: Emergency Medicine

## 2022-07-20 MED ORDER — METRONIDAZOLE 500 MG PO TABS: 500.0000 mg | ORAL_TABLET | Freq: Two times a day (BID) | ORAL | 0 refills | Status: DC

## 2022-07-20 NOTE — Progress Notes (Signed)
Rx sent to alternative pharmacy per pt request.

## 2022-07-21 LAB — PANORAMA PRENATAL TEST FULL PANEL:PANORAMA TEST PLUS 5 ADDITIONAL MICRODELETIONS: FETAL FRACTION: 11.6

## 2022-07-21 LAB — CYTOLOGY - PAP
Comment: NEGATIVE
Diagnosis: NEGATIVE
High risk HPV: NEGATIVE

## 2022-07-23 LAB — HORIZON CUSTOM: REPORT SUMMARY: POSITIVE — AB

## 2022-07-28 ENCOUNTER — Other Ambulatory Visit: Payer: Self-pay | Admitting: *Deleted

## 2022-07-28 DIAGNOSIS — D573 Sickle-cell trait: Secondary | ICD-10-CM

## 2022-07-28 NOTE — Progress Notes (Signed)
2nd TC to pt, no answer, left HIPAA compliant VM. Advised pt MyChart message would be sent. MyChart message advising of carrier status for [facility] and of referral made to MFM genetic counselors. Education on SCT included in message.

## 2022-07-30 ENCOUNTER — Encounter: Payer: Medicaid Other | Admitting: Obstetrics and Gynecology

## 2022-08-03 ENCOUNTER — Other Ambulatory Visit: Payer: Self-pay

## 2022-08-03 DIAGNOSIS — Z3480 Encounter for supervision of other normal pregnancy, unspecified trimester: Secondary | ICD-10-CM

## 2022-08-03 MED ORDER — VITAFOL GUMMIES 3.33-0.333-34.8 MG PO CHEW: 3.0000 | CHEWABLE_TABLET | Freq: Every day | ORAL | 11 refills | Status: DC

## 2022-08-09 ENCOUNTER — Encounter (HOSPITAL_COMMUNITY): Payer: Self-pay | Admitting: Family Medicine

## 2022-08-09 ENCOUNTER — Inpatient Hospital Stay (HOSPITAL_COMMUNITY)
Admission: AD | Admit: 2022-08-09 | Discharge: 2022-08-09 | Disposition: A | Discharge status: Home / Self Care | Payer: Medicaid Other | Attending: Family Medicine | Admitting: Family Medicine

## 2022-08-09 DIAGNOSIS — D573 Sickle-cell trait: Secondary | ICD-10-CM | POA: Insufficient documentation

## 2022-08-09 DIAGNOSIS — G43909 Migraine, unspecified, not intractable, without status migrainosus: Secondary | ICD-10-CM | POA: Diagnosis not present

## 2022-08-09 DIAGNOSIS — Z3A17 17 weeks gestation of pregnancy: Secondary | ICD-10-CM | POA: Insufficient documentation

## 2022-08-09 DIAGNOSIS — O99012 Anemia complicating pregnancy, second trimester: Secondary | ICD-10-CM | POA: Diagnosis not present

## 2022-08-09 DIAGNOSIS — O99352 Diseases of the nervous system complicating pregnancy, second trimester: Secondary | ICD-10-CM | POA: Insufficient documentation

## 2022-08-09 DIAGNOSIS — Z87891 Personal history of nicotine dependence: Secondary | ICD-10-CM | POA: Insufficient documentation

## 2022-08-09 LAB — URINALYSIS, ROUTINE W REFLEX MICROSCOPIC
Bilirubin Urine: NEGATIVE
Glucose, UA: NEGATIVE mg/dL
Hgb urine dipstick: NEGATIVE
Ketones, ur: NEGATIVE mg/dL
Leukocytes,Ua: NEGATIVE
Nitrite: NEGATIVE
Protein, ur: NEGATIVE mg/dL
Specific Gravity, Urine: 1.009 (ref 1.005–1.030)
pH: 7 (ref 5.0–8.0)

## 2022-08-09 MED ORDER — LACTATED RINGERS IV BOLUS
1000.0000 mL | Freq: Once | INTRAVENOUS | Status: AC
  Administered 2022-08-09: 1000 mL via INTRAVENOUS

## 2022-08-09 MED ORDER — PROCHLORPERAZINE EDISYLATE 10 MG/2ML IJ SOLN
10.0000 mg | Freq: Once | INTRAMUSCULAR | Status: AC
  Administered 2022-08-09: 10 mg via INTRAVENOUS
  Filled 2022-08-09: qty 2

## 2022-08-09 MED ORDER — CAFFEINE 200 MG PO TABS
200.0000 mg | ORAL_TABLET | Freq: Once | ORAL | Status: AC
  Administered 2022-08-09: 200 mg via ORAL
  Filled 2022-08-09: qty 1

## 2022-08-09 MED ORDER — DIPHENHYDRAMINE HCL 50 MG/ML IJ SOLN
25.0000 mg | Freq: Once | INTRAMUSCULAR | Status: AC
  Administered 2022-08-09: 25 mg via INTRAVENOUS
  Filled 2022-08-09: qty 1

## 2022-08-09 NOTE — Discharge Instructions (Signed)

## 2022-08-09 NOTE — MAU Provider Note (Signed)
History     CSN: [id number]  Arrival date and time: 08/09/22 1429   Event Date/Time   First Provider Initiated Contact with Patient 08/09/22 1450      Chief Complaint  Patient presents with   Headache   HPI  Teresa Burns is a 37 y.o. G2P1001 at 80w3dwho presents for evaluation of a headache. Patient reports she struggles with migraines and has had one since Friday. She reports she is sensitive to both light and sound. Patient rates the pain as a 10/10 and has tried flexeril, tylenol and magnesium for the pain with no relief. She denies any vaginal bleeding, discharge, and leaking of fluid. Denies any constipation, diarrhea or any urinary complaints. Reports normal fetal movement.   OB History     Gravida  2   Para  1   Term  1   Preterm  0   AB  0   Living  1      SAB  0   IAB  0   Ectopic  0   Multiple  0   Live Births  1           Past Medical History:  Diagnosis Date   Herpes    [place]     History reviewed. No pertinent surgical history.  Family History  Problem Relation Age of Onset   Stroke Maternal Grandmother    Diabetes Maternal Grandmother    Hypertension Maternal Grandmother    Cancer Maternal Grandmother     Social History   Tobacco Use   Smoking status: Former    Packs/day: 0.50    Types: Cigarettes    Quit date: 05/16/2022    Years since quitting: 0.2   Smokeless tobacco: Never   Tobacco comments:    1/2PPD  Vaping Use   Vaping Use: Never used  Substance Use Topics   Alcohol use: Not Currently    Comment: pt states 8 to 10 shots nightly   Drug use: Not Currently    Types: Marijuana    Allergies: No Known Allergies  No medications prior to admission.    Review of Systems  Constitutional: Negative.  Negative for fatigue and fever.  HENT: Negative.    Respiratory: Negative.  Negative for shortness of breath.   Cardiovascular: Negative.  Negative for chest pain.  Gastrointestinal: Negative.   Negative for abdominal pain, constipation, diarrhea, nausea and vomiting.  Genitourinary: Negative.  Negative for dysuria, vaginal bleeding and vaginal discharge.  Neurological:  Positive for headaches. Negative for dizziness.   Physical Exam   Blood pressure 117/69, pulse 98, temperature 99 F (37.2 C), temperature source Oral, resp. rate 15, weight 63.8 kg, last menstrual period 04/09/2022, SpO2 100 %.  Patient Vitals for the past 24 hrs:  BP Temp Temp src Pulse Resp SpO2 Weight  08/09/22 1643 117/69 -- -- 98 15 100 % --  08/09/22 1436 115/73 99 F (37.2 C) Oral 100 14 -- 63.8 kg    Physical Exam Vitals and nursing note reviewed.  Constitutional:      General: She is not in acute distress.    Appearance: She is well-developed.  HENT:     Head: Normocephalic.  Eyes:     General: No visual field deficit.    Pupils: Pupils are equal, round, and reactive to light.  Cardiovascular:     Rate and Rhythm: Normal rate and regular rhythm.     Heart sounds: Normal heart sounds.  Pulmonary:  Effort: Pulmonary effort is normal. No respiratory distress.     Breath sounds: Normal breath sounds.  Abdominal:     General: Bowel sounds are normal. There is no distension.     Palpations: Abdomen is soft.     Tenderness: There is no abdominal tenderness.  Skin:    General: Skin is warm and dry.  Neurological:     Mental Status: She is alert and oriented to person, place, and time. Mental status is at baseline.     Cranial Nerves: No cranial nerve deficit or facial asymmetry.     Sensory: No sensory deficit.     Motor: No weakness.     Coordination: Coordination normal.  Psychiatric:        Mood and Affect: Mood normal.        Behavior: Behavior normal.        Thought Content: Thought content normal.        Judgment: Judgment normal.     FHT: 159 bpm   MAU Course  Procedures  Results for orders placed or performed during the hospital encounter of 08/09/22 (from the past 24  hour(s))  Urinalysis, Routine w reflex microscopic -Urine, Clean Catch     Status: Abnormal   Collection Time: 08/09/22  2:53 PM  Result Value Ref Range   Color, Urine STRAW (A) YELLOW   APPearance CLEAR CLEAR   Specific Gravity, Urine 1.009 1.005 - 1.030   pH 7.0 5.0 - 8.0   Glucose, UA NEGATIVE NEGATIVE mg/dL   Hgb urine dipstick NEGATIVE NEGATIVE   Bilirubin Urine NEGATIVE NEGATIVE   Ketones, ur NEGATIVE NEGATIVE mg/dL   Protein, ur NEGATIVE NEGATIVE mg/dL   Nitrite NEGATIVE NEGATIVE   Leukocytes,Ua NEGATIVE NEGATIVE    MDM Labs ordered and reviewed.   UA LR bolus Compazine IV Benedryl IV Caffeine PO  Patient reports complete resolution of pain.  Message sent to [facility] to schedule appointment for further management in pregnancy.  Assessment and Plan   1. Migraine without status migrainosus, not intractable, unspecified migraine type   2. [redacted] weeks gestation of pregnancy     -Discharge home in stable condition -Second trimester precautions discussed -Patient advised to follow-up with OB as scheduled for prenatal care -Patient may return to MAU as needed or if her condition were to change or worsen  Wende Mott, CNM 08/09/2022, 2:50 PM

## 2022-08-09 NOTE — MAU Note (Signed)
.  Teresa Burns is a 37 y.o. at [redacted]w[redacted]d here in MAU reporting: Has had a migraine since Friday. Has been taking flexeril and tylenol at home, last took 1000mg  of tylenol at 0830. Denies VB or LOF.   Pain score: 10 Vitals:   08/09/22 1436  BP: 115/73  Pulse: 100  Resp: 14  Temp: 99 F (37.2 C)     FHT:159 Lab orders placed from triage:  UA

## 2022-08-12 ENCOUNTER — Encounter: Payer: Medicaid Other | Admitting: Obstetrics & Gynecology

## 2022-08-12 ENCOUNTER — Institutional Professional Consult (permissible substitution): Payer: Medicaid Other | Admitting: Licensed Clinical Social Worker

## 2022-08-13 ENCOUNTER — Ambulatory Visit (INDEPENDENT_AMBULATORY_CARE_PROVIDER_SITE_OTHER): Payer: Medicaid Other | Admitting: Obstetrics & Gynecology

## 2022-08-13 VITALS — BP 116/77 | HR 84 | Wt 136.0 lb

## 2022-08-13 DIAGNOSIS — Z3482 Encounter for supervision of other normal pregnancy, second trimester: Secondary | ICD-10-CM

## 2022-08-13 DIAGNOSIS — O0990 Supervision of high risk pregnancy, unspecified, unspecified trimester: Secondary | ICD-10-CM

## 2022-08-13 DIAGNOSIS — Z3A18 18 weeks gestation of pregnancy: Secondary | ICD-10-CM

## 2022-08-13 DIAGNOSIS — Z3480 Encounter for supervision of other normal pregnancy, unspecified trimester: Secondary | ICD-10-CM

## 2022-08-13 NOTE — Progress Notes (Signed)
   PRENATAL VISIT NOTE  Subjective:  Teresa Burns is a 37 y.o. G2P1001 at [redacted]w[redacted]d being seen today for ongoing prenatal care.  She is currently monitored for the following issues for this high-risk pregnancy and has Pleuritic chest pain; Herpes genitalis; Encounter for general counseling on prescription of oral contraceptives; and Supervision of other normal pregnancy, antepartum on their problem list.  Patient reports headache.  Contractions: Not present. Vag. Bleeding: None.  Movement: Present. Denies leaking of fluid.   The following portions of the patient's history were reviewed and updated as appropriate: allergies, current medications, past family history, past medical history, past social history, past surgical history and problem list.   Objective:   Vitals:   08/13/22 1135  BP: 116/77  Pulse: 84  Weight: 61.7 kg    Fetal Status:     Movement: Present     General:  Alert, oriented and cooperative. Patient is in no acute distress.  Skin: Skin is warm and dry. No rash noted.   Cardiovascular: Normal heart rate noted  Respiratory: Normal respiratory effort, no problems with respiration noted  Abdomen: Soft, gravid, appropriate for gestational age.  Pain/Pressure: Absent     Pelvic: Cervical exam deferred        Extremities: Normal range of motion.  Edema: None  Mental Status: Normal mood and affect. Normal behavior. Normal judgment and thought content.   Assessment and Plan:  Pregnancy: G2P1001 at [redacted]w[redacted]d 1. Supervision of high risk pregnancy, antepartum screening - AFP, Serum, Open Spina Bifida  Preterm labor symptoms and general obstetric precautions including but not limited to vaginal bleeding, contractions, leaking of fluid and fetal movement were reviewed in detail with the patient. Please refer to After Visit Summary for other counseling recommendations.   Return in about 4 weeks (around 09/10/2022).  Future Appointments  Date Time Provider [place]   08/20/2022  9:30 AM [facility] NURSE [facility]  08/20/2022  9:45 AM [facility] US4 [facility]  08/24/2022 10:30 AM [facility] GENETIC COUNSELING RM [facility]  09/10/2022  9:55 AM Griffin Basil, MD [facility]  09/24/2022  8:45 AM [facility] Bob[facility] Morton, PA-C [facility]    Emeterio Reeve, MD

## 2022-08-13 NOTE — Progress Notes (Signed)
ROB, c/o HA 10/10, red rash under her arm.

## 2022-08-15 LAB — AFP, SERUM, OPEN SPINA BIFIDA
AFP MoM: 1.06
AFP Value: 55 ng/mL
Gest. Age on Collection Date: 18 weeks
Maternal Age At EDD: 35.2 yr
OSBR Risk 1 IN: 10000
Test Results:: NEGATIVE
Weight: 136 [lb_av]

## 2022-08-20 ENCOUNTER — Ambulatory Visit: Payer: Medicaid Other | Attending: Obstetrics and Gynecology

## 2022-08-20 ENCOUNTER — Encounter: Payer: Self-pay | Admitting: *Deleted

## 2022-08-20 ENCOUNTER — Other Ambulatory Visit: Payer: Self-pay | Admitting: *Deleted

## 2022-08-20 ENCOUNTER — Ambulatory Visit: Payer: Medicaid Other | Admitting: *Deleted

## 2022-08-20 VITALS — BP 118/65 | HR 94

## 2022-08-20 DIAGNOSIS — O09522 Supervision of elderly multigravida, second trimester: Secondary | ICD-10-CM

## 2022-08-20 DIAGNOSIS — Z3A19 19 weeks gestation of pregnancy: Secondary | ICD-10-CM | POA: Diagnosis not present

## 2022-08-20 DIAGNOSIS — O98512 Other viral diseases complicating pregnancy, second trimester: Secondary | ICD-10-CM | POA: Insufficient documentation

## 2022-08-20 DIAGNOSIS — Z3482 Encounter for supervision of other normal pregnancy, second trimester: Secondary | ICD-10-CM | POA: Diagnosis not present

## 2022-08-20 DIAGNOSIS — Z3480 Encounter for supervision of other normal pregnancy, unspecified trimester: Secondary | ICD-10-CM | POA: Diagnosis not present

## 2022-08-20 DIAGNOSIS — Z363 Encounter for antenatal screening for malformations: Secondary | ICD-10-CM | POA: Diagnosis not present

## 2022-08-20 DIAGNOSIS — B009 Herpesviral infection, unspecified: Secondary | ICD-10-CM

## 2022-08-24 ENCOUNTER — Telehealth: Payer: Self-pay | Admitting: Genetics

## 2022-08-24 ENCOUNTER — Ambulatory Visit: Payer: Medicaid Other

## 2022-08-24 ENCOUNTER — Encounter: Payer: Self-pay | Admitting: Genetics

## 2022-08-24 NOTE — Telephone Encounter (Signed)
Called [place] to see if she needed any assistance logging onto the [employer] for her telehealth genetic counseling visit. Left voicemail with genetic counseling's phone number.

## 2022-08-26 ENCOUNTER — Encounter: Payer: Medicaid Other | Admitting: Obstetrics and Gynecology

## 2022-08-27 ENCOUNTER — Encounter: Payer: Medicaid Other | Admitting: Obstetrics

## 2022-09-09 ENCOUNTER — Encounter: Payer: Medicaid Other | Admitting: Obstetrics & Gynecology

## 2022-09-10 ENCOUNTER — Ambulatory Visit (INDEPENDENT_AMBULATORY_CARE_PROVIDER_SITE_OTHER): Payer: Medicaid Other | Admitting: Obstetrics and Gynecology

## 2022-09-10 VITALS — BP 124/81 | HR 88 | Wt 145.0 lb

## 2022-09-10 DIAGNOSIS — A6004 Herpesviral vulvovaginitis: Secondary | ICD-10-CM

## 2022-09-10 DIAGNOSIS — Z3A22 22 weeks gestation of pregnancy: Secondary | ICD-10-CM

## 2022-09-10 DIAGNOSIS — Z3480 Encounter for supervision of other normal pregnancy, unspecified trimester: Secondary | ICD-10-CM

## 2022-09-10 NOTE — Progress Notes (Signed)
   PRENATAL VISIT NOTE  Subjective:  Teresa Burns is a 37 y.o. G2P1001 at [redacted]w[redacted]d being seen today for ongoing prenatal care.  She is currently monitored for the following issues for this low-risk pregnancy and has Pleuritic chest pain; Herpes genitalis; and Supervision of other normal pregnancy, antepartum on their problem list.  Patient doing well with no acute concerns today. She reports no complaints.  Contractions: Not present. Vag. Bleeding: None.  Movement: Present. Denies leaking of fluid.   The following portions of the patient's history were reviewed and updated as appropriate: allergies, current medications, past family history, past medical history, past social history, past surgical history and problem list. Problem list updated.  Objective:   Vitals:   09/10/22 0959  BP: 124/81  Pulse: 88  Weight: 145 lb (65.8 kg)    Fetal Status: Fetal Heart Rate (bpm): 130 Fundal Height: 22 cm Movement: Present     General:  Alert, oriented and cooperative. Patient is in no acute distress.  Skin: Skin is warm and dry. No rash noted.   Cardiovascular: Normal heart rate noted  Respiratory: Normal respiratory effort, no problems with respiration noted  Abdomen: Soft, gravid, appropriate for gestational age.  Pain/Pressure: Present     Pelvic: Cervical exam deferred        Extremities: Normal range of motion.     Mental Status:  Normal mood and affect. Normal behavior. Normal judgment and thought content.   Assessment and Plan:  Pregnancy: G2P1001 at [redacted]w[redacted]d  1. Supervision of other normal pregnancy, antepartum Continue routine prenatal care  2. [redacted] weeks gestation of pregnancy   3. 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  Please refer to After Visit Summary for other  counseling recommendations.   Return in about 4 weeks (around 10/08/2022) for ROB, virtual.   Mariel Aloe, MD Faculty Attending Center for [facility]

## 2022-09-24 ENCOUNTER — Institutional Professional Consult (permissible substitution): Payer: Medicaid Other | Admitting: Physician Assistant

## 2022-10-07 ENCOUNTER — Telehealth: Payer: Self-pay | Admitting: *Deleted

## 2022-10-07 NOTE — Telephone Encounter (Signed)
TC from pt reporting severe pain in back and going down leg. Reports difficulty putting weight on the affected side or walking. Sts "my ass is on fire." She believes the pain is "sciatica". Pt reports good FM and denies UCs, VB, or LOF. Advised Tylenol 1000mg , lying down in a comfortable position with pillow between knees and applying heat to area for 20-30 min. Advised to try gentle stretching of lower back with knees bent after heat application. Advised to seek care if no relief is achieved with above measures.

## 2022-10-08 ENCOUNTER — Encounter: Payer: Self-pay | Admitting: Obstetrics and Gynecology

## 2022-10-08 ENCOUNTER — Telehealth (INDEPENDENT_AMBULATORY_CARE_PROVIDER_SITE_OTHER): Payer: Medicaid Other | Admitting: Obstetrics and Gynecology

## 2022-10-08 DIAGNOSIS — O26892 Other specified pregnancy related conditions, second trimester: Secondary | ICD-10-CM

## 2022-10-08 DIAGNOSIS — O09529 Supervision of elderly multigravida, unspecified trimester: Secondary | ICD-10-CM | POA: Insufficient documentation

## 2022-10-08 DIAGNOSIS — A6004 Herpesviral vulvovaginitis: Secondary | ICD-10-CM

## 2022-10-08 DIAGNOSIS — O09522 Supervision of elderly multigravida, second trimester: Secondary | ICD-10-CM

## 2022-10-08 DIAGNOSIS — Z3A26 26 weeks gestation of pregnancy: Secondary | ICD-10-CM

## 2022-10-08 DIAGNOSIS — M79606 Pain in leg, unspecified: Secondary | ICD-10-CM

## 2022-10-08 DIAGNOSIS — Z3480 Encounter for supervision of other normal pregnancy, unspecified trimester: Secondary | ICD-10-CM

## 2022-10-08 DIAGNOSIS — O98312 Other infections with a predominantly sexual mode of transmission complicating pregnancy, second trimester: Secondary | ICD-10-CM

## 2022-10-08 HISTORY — DX: Supervision of elderly multigravida, unspecified trimester: O09.529

## 2022-10-08 NOTE — Progress Notes (Signed)
   OBSTETRICS PRENATAL VIRTUAL VISIT ENCOUNTER NOTE  [facility]   Patient location: Home  I connected with Logan Bores on 10/08/22 at 11:15 AM EDT by MyChart Video Encounter and verified that I am speaking with the correct person using two identifiers. I discussed the limitations, risks, security and privacy concerns of performing an evaluation and management service virtually and the availability of in person appointments. I also discussed with the patient that there may be a patient responsible charge related to this service. The patient expressed understanding and agreed to proceed. Subjective:  CHRYS VANDERHOFF is a 37 y.o. G2P1001 at [redacted]w[redacted]d being seen today for ongoing prenatal care.  She is currently monitored for the following issues for this low-risk pregnancy and has Pleuritic chest pain; Herpes genitalis; Supervision of other normal pregnancy, antepartum; and Advanced maternal age in multigravida on their problem list.  Patient reports  persistent back pain radiating into the buttock and leg c/w sciatica .  Contractions: Not present.  .  Movement: Present. Denies any leaking of fluid.   The following portions of the patient's history were reviewed and updated as appropriate: allergies, current medications, past family history, past medical history, past social history, past surgical history and problem list.   Objective:  There were no vitals filed for this visit.  Fetal Status:     Movement: Present     General:  Alert, oriented and cooperative. Patient is in no acute distress.  Respiratory: Normal respiratory effort, no problems with respiration noted  Mental Status: Normal mood and affect. Normal behavior. Normal judgment and thought content.  Rest of physical exam deferred due to type of encounter  Imaging: No results found.  Assessment and Plan:  Pregnancy: G2P1001 at [redacted]w[redacted]d 1. [redacted] weeks gestation of pregnancy   2.  Supervision of other normal pregnancy, antepartum Continue routine prenatal care. Pt desires BTL, briefly discussed risks and benefits.  She will need to sign consent due to Medicaid  Pt has increased leg pain due to possible sciatica.  Pt desires referral to physical therapy  3. 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 I discussed the assessment and treatment plan with the patient. The patient was provided an opportunity to ask questions and all were answered. The patient agreed with the plan and demonstrated an understanding of the instructions. The patient was advised to call back or seek an in-person office evaluation/go to MAU at [facility] for any urgent or concerning symptoms. Please refer to After Visit Summary for other counseling recommendations.   I provided 10 minutes of face-to-face time during this encounter.  Return in about 2 weeks (around 10/22/2022) for ROB, in person, 3rd trim labs, 2 hr GTT.  Future Appointments  Date Time Provider Department Center  10/22/2022  9:15 AM [facility] NURSE [facility]  10/22/2022  9:30 AM [facility] US3 [facility]    Warden Fillers, MD Center for [employer], [facility]

## 2022-10-08 NOTE — Progress Notes (Signed)
Virtual Visit via Telephone Note  I connected with Teresa Burns on 10/08/22 at 11:15 AM EDT by telephone and verified that I am speaking with the correct person using two identifiers.  Location: Patient: Home Provider: [facility]   Pt reports sciatica x 1 month no relief with OTC pain meds nor massages.

## 2022-10-12 ENCOUNTER — Ambulatory Visit: Payer: Medicaid Other | Attending: Obstetrics and Gynecology | Admitting: Physical Therapy

## 2022-10-12 ENCOUNTER — Encounter: Payer: Self-pay | Admitting: Physical Therapy

## 2022-10-12 ENCOUNTER — Other Ambulatory Visit: Payer: Self-pay

## 2022-10-12 DIAGNOSIS — M5432 Sciatica, left side: Secondary | ICD-10-CM | POA: Diagnosis present

## 2022-10-12 DIAGNOSIS — M62838 Other muscle spasm: Secondary | ICD-10-CM | POA: Insufficient documentation

## 2022-10-12 DIAGNOSIS — O26899 Other specified pregnancy related conditions, unspecified trimester: Secondary | ICD-10-CM | POA: Insufficient documentation

## 2022-10-12 DIAGNOSIS — M6281 Muscle weakness (generalized): Secondary | ICD-10-CM | POA: Diagnosis present

## 2022-10-12 DIAGNOSIS — R279 Unspecified lack of coordination: Secondary | ICD-10-CM | POA: Diagnosis present

## 2022-10-12 DIAGNOSIS — M79606 Pain in leg, unspecified: Secondary | ICD-10-CM | POA: Diagnosis not present

## 2022-10-12 NOTE — Therapy (Signed)
OUTPATIENT PHYSICAL THERAPY THORACOLUMBAR EVALUATION   Patient Name: Teresa Burns MRN: [id number] DOB:07-11-1985, 37 y.o., female Today's Date: 10/12/2022  END OF SESSION:  PT End of Session - 10/12/22 0939     Visit Number 1    Date for PT Re-Evaluation 01/04/23    Authorization Type Medicaid [employer]    PT Start Time [phone]    PT Stop Time 1015    PT Time Calculation (min) 38 min    Activity Tolerance Patient tolerated treatment well             Past Medical History:  Diagnosis Date   Herpes    Sickle cell trait (HCC)    Past Surgical History:  Procedure Laterality Date   NO PAST SURGERIES     Patient Active Problem List   Diagnosis Date Noted   Advanced maternal age in multigravida 10/08/2022   Supervision of other normal pregnancy, antepartum 06/08/2022   Pleuritic chest pain 07/15/2015   Herpes genitalis 07/15/2015     REFERRING PROVIDER: Mariel Aloe MD  REFERRING DIAG: O26.899, M79.606 pregnancy related leg pain, sciatica  Rationale for Evaluation and Treatment: Rehabilitation  THERAPY DIAG:  sciatica ONSET DATE: February 2024  SUBJECTIVE:                                                                                                                                                                                           SUBJECTIVE STATEMENT: In February, left sciatica started and has progressively gotten worse.  Recently was walking at the mall and it took my breath.  I'm scared to walk now.    PERTINENT HISTORY:  Pregnant [redacted] weeks due in August; normal pregnancy no precautions Son is 64 years old Has access to a pool PAIN:  PAIN:  Are you having pain? Yes NPRS scale: 6-10/10   Pain location: some left LBP but mostly buttock pain, HS to knee level  Numbness and tingling as well as pain; sharp Aggravating factors: bending over, getting in/out of car;  squatting, walking too fast Relieving factors: deep massage to piriformis, stretching but  hard to get in positions now due to pregnancy   PRECAUTIONS: Other: pregnancy  WEIGHT BEARING RESTRICTIONS: No  FALLS:  Has patient fallen in last 6 months? No  LIVING ENVIRONMENT: Lives with: lives with their family Lives in: House/apartment  OCCUPATION: stay at home  PLOF: Independent  PATIENT GOALS: pain relief for remainder of pregnancy    OBJECTIVE:    PATIENT SURVEYS:  Modified Oswestry 56% self perceived disability    COGNITION: Overall cognitive status: Within functional limits for tasks assessed  PALPATION: Multiple tender points left gluteals  LUMBAR ROM:   AROM eval  Flexion WFLS  Extension 15  Right lateral flexion 30  Left lateral flexion 30  Right rotation   Left rotation    (Blank rows = not tested)  TRUNK STRENGTH:  Decreased activation of transverse abdominus muscles; abdominals 4-/5; decreased activation of lumbar multifidi; trunk extensors 4-/5  LOWER EXTREMITY ROM:   WFLS  LOWER EXTREMITY MMT:   Left hip abduction 3+/5; left knee extension 4-/5, left knee flexion 4-/5  FUNCTIONAL TESTS:  Unable to rise from sit to stand without moderate UE assist Pelvic drop on left and lateral trunk lean with SLS  Negative slump test GAIT:  Comments: antalgic, decreased gait speed  TODAY'S TREATMENT:                                                                                                                              DATE: 5/14    PATIENT EDUCATION:  Education details: Educated patient on anatomy and physiology of current symptoms, prognosis, plan of care as well as initial self care strategies to promote recovery; discussed using a Belly Band support Person educated: Patient Education method: Explanation Education comprehension: verbalized understanding  HOME EXERCISE PROGRAM: Access Code: [email or web address] URL: [email or web address] Date: 10/12/2022 Prepared by: Lavinia Sharps  Exercises - Seated Table Piriformis  Stretch  - 1 x daily - 7 x weekly - 1 sets - 3 reps - 30 hold - Child's Pose with Sidebending  - 1 x daily - 7 x weekly - 1 sets - 3 reps - 30 hold - Quadruped Cat Cow  - 1 x daily - 7 x weekly - 1 sets - 10 reps - Seated Piriformis Stretch with Trunk Bend  - 1 x daily - 7 x weekly - 1 sets - 3 reps - 30 hold  ASSESSMENT:  CLINICAL IMPRESSION: Patient is a 37 y.o. female who was seen today for physical therapy evaluation and treatment for pregnancy related left buttock and posterior thigh pain extended to the knee.  Multiple tender points identified in left gluteals.  Decreased motor control noted in left gluteals with compensatory pelvic drop and lateral trunk lean.  She would benefit from patient education; therapeutic ex, aquatic PT for buoyancy properties and manual interventions.    OBJECTIVE IMPAIRMENTS: decreased activity tolerance, difficulty walking, decreased ROM, decreased strength, increased fascial restrictions, impaired perceived functional ability, and pain.   ACTIVITY LIMITATIONS: carrying, lifting, bending, sitting, standing, and squatting  PARTICIPATION LIMITATIONS: meal prep, cleaning, laundry, shopping, and community activity  PERSONAL FACTORS: 1 comorbidity: some previous history of back pain  are also affecting patient's functional outcome.   REHAB POTENTIAL: Good  CLINICAL DECISION MAKING: Stable/uncomplicated  EVALUATION COMPLEXITY: Low   GOALS: Goals reviewed with patient? Yes  SHORT TERM GOALS: Target date: 11/23/2022    The patient will demonstrate knowledge of basic self care strategies and exercises to promote healing   Baseline: Goal status:  INITIAL  2.  Patient will be able to walk medium distances with pain level 5/10 or less Baseline:  Goal status: INITIAL  3.  The patient will have improved hip strength to at least 4/5 needed for standing, walking longer distances and descending stairs at home and in the community  Baseline:  Goal status:  INITIAL     LONG TERM GOALS: Target date: 01/04/2023    The patient will be independent in a safe self progression of a home exercise program and self care strategies to promote further recovery of function  Baseline:  Goal status: INITIAL  2.  Patient will be able to walk 20 min with pain 4/10 Baseline:  Goal status: INITIAL  3.  The patient will have improved hip strength to at least 4+/5 needed for standing, walking longer distances and descending stairs at home and in the community  Baseline:  Goal status: INITIAL  4.  The patient will have improved trunk flexor and extensor muscle strength to at least 4+/5 needed for lifting her future newborn and baby carrier Baseline:  Goal status: INITIAL  5.  Modified Oswestry improved to 46% Baseline:  Goal status: INITIAL   PLAN:  PT FREQUENCY: 1x/week  PT DURATION: 12 weeks  PLANNED INTERVENTIONS: Therapeutic exercises, Therapeutic activity, Neuromuscular re-education, Patient/Family education, Self Care, Joint mobilization, Aquatic Therapy, Dry Needling, Electrical stimulation, Cryotherapy, Moist heat, Taping, Manual therapy, and Re-evaluation.  PLAN FOR NEXT SESSION: DN to left gluteals; ball ex's (pt interested in getting one for home; follow up on belly band;  piriformis and glute stretch review;  add clams; add standing lumbar extensions to HEP; aquatic PT  Lavinia Sharps, PT 10/12/22 7:25 PM Phone: [phone] Fax: [phone]

## 2022-10-12 NOTE — Patient Instructions (Signed)
     [facility] Welcome to [facility]! Here you will find all the information you will need regarding your pool therapy. If you have further questions at any time, please call our office at [phone]. After completing your initial evaluation in the [facility], you may be eligible to complete a portion of your therapy in the pool. A typical week of therapy will consist of 1-2 typical physical therapy visits at our [facility] location and an additional session of therapy in the pool located at the [facility] at [address]. [address], [place] [address]. The phone number at the pool site is [phone]. Please call this number if you are running late or need to cancel your appointment.  Aquatic therapy will be offered on Wednesday mornings and Friday afternoons. Each session will last approximately 45 minutes. All scheduling and payments for aquatic therapy sessions, including cancelations, will be done through our [facility] location.  To be eligible for aquatic therapy, these criteria must be met: You must be able to independently change in the locker room and get to the pool deck. A caregiver can come with you to help if needed. There are benches for a caregiver to sit on next to the pool. No one with an open wound is permitted in the pool.  Handicap parking is available in the front and there is a drop off option for even closer accessibility. Please arrive 15 minutes prior to your appointment to prepare for your pool session. You must sign in at the front desk upon your arrival. Please be sure to attend to any toileting needs prior to entering the pool. Locker rooms for changing are available.  There is direct access to the pool deck from the locker room. You can lock your belongings in a locker or bring them with you poolside. Your therapist will greet you on the pool deck. There may be other swimmers in the pool at the  same time but your session is one-on-one with the therapist.   

## 2022-10-18 ENCOUNTER — Ambulatory Visit: Payer: Medicaid Other | Admitting: Physical Therapy

## 2022-10-18 DIAGNOSIS — M5432 Sciatica, left side: Secondary | ICD-10-CM | POA: Diagnosis not present

## 2022-10-18 DIAGNOSIS — M6281 Muscle weakness (generalized): Secondary | ICD-10-CM

## 2022-10-18 NOTE — Therapy (Signed)
OUTPATIENT PHYSICAL THERAPY THORACOLUMBAR EVALUATION   Patient Name: Teresa Burns MRN: [id number] DOB:January 22, 1986, 37 y.o., female Today's Date: 10/18/2022  END OF SESSION:  PT End of Session - 10/18/22 1450     Visit Number 2    Date for PT Re-Evaluation 01/04/23    Authorization Type Medicaid [employer] will submit    PT Start Time 1450    PT Stop Time 1530    PT Time Calculation (min) 40 min    Activity Tolerance Patient tolerated treatment well              Past Medical History:  Diagnosis Date   Herpes    Sickle cell trait (HCC)    Past Surgical History:  Procedure Laterality Date   NO PAST SURGERIES     Patient Active Problem List   Diagnosis Date Noted   Advanced maternal age in multigravida 10/08/2022   Supervision of other normal pregnancy, antepartum 06/08/2022   Pleuritic chest pain 07/15/2015   Herpes genitalis 07/15/2015     REFERRING PROVIDER: Mariel Aloe MD  REFERRING DIAG: O26.899, M79.606 pregnancy related leg pain, sciatica  Rationale for Evaluation and Treatment: Rehabilitation  THERAPY DIAG:  sciatica ONSET DATE: February 2024  SUBJECTIVE:                                                                                                                                                                                           SUBJECTIVE STATEMENT: Pt states it is hurting her a lot today. Her baby is moving around a lot. Pt reports she had a big muscle spasm in her calf this morning. Her belly band should be coming in today.   PERTINENT HISTORY:  Pregnant [redacted] weeks due in August; normal pregnancy no precautions Son is 35 years old Has access to a pool PAIN:  PAIN:  Are you having pain? Yes NPRS scale: 7/10   Pain location: some left LBP but mostly buttock pain, HS to knee level  Numbness and tingling as well as pain; sharp Aggravating factors: bending over, getting in/out of car;  squatting, walking too fast Relieving factors: deep  massage to piriformis, stretching but hard to get in positions now due to pregnancy   PRECAUTIONS: Other: pregnancy  WEIGHT BEARING RESTRICTIONS: No  FALLS:  Has patient fallen in last 6 months? No  LIVING ENVIRONMENT: Lives with: lives with their family Lives in: House/apartment  OCCUPATION: stay at home  PLOF: Independent  PATIENT GOALS: pain relief for remainder of pregnancy    OBJECTIVE:   PATIENT SURVEYS:  Modified Oswestry 56% self perceived disability   COGNITION: Overall cognitive status: Within  functional limits for tasks assessed    PALPATION: Multiple tender points left gluteals  LUMBAR ROM:   AROM eval  Flexion WFLS  Extension 15  Right lateral flexion 30  Left lateral flexion 30  Right rotation   Left rotation    (Blank rows = not tested)  TRUNK STRENGTH:  Decreased activation of transverse abdominus muscles; abdominals 4-/5; decreased activation of lumbar multifidi; trunk extensors 4-/5  LOWER EXTREMITY ROM:   WFLS  LOWER EXTREMITY MMT:   Left hip abduction 3+/5; left knee extension 4-/5, left knee flexion 4-/5  FUNCTIONAL TESTS:  Unable to rise from sit to stand without moderate UE assist Pelvic drop on left and lateral trunk lean with SLS  Negative slump test GAIT:  Comments: antalgic, decreased gait speed  TODAY'S TREATMENT:                                                                                                                              DATE: 10/18/22 Nustep L4 x 5 min LEs/UEs S/L Clamshell 2x10 S/L pillow squeeze + pelvic floor contraction 2x10 Supine hamstring stretch with strap 2x30 sec Supine ITB stretch with strap 2x30 sec Supine gastroc stretch x30 sec Supine calf stretch with strap x30 sec Standing gastroc stretch x 30 sec Standing soleus stretch x 30 sec Supine 90/90 sciatic nerve glide x10 Seated piriformis stretch x30 sec STM & TPR L glute/piriformis  Skilled assessment and palpation for TPDN Trigger Point  Dry-Needling  Treatment instructions: Expect mild to moderate muscle soreness. S/S of pneumothorax if dry needled over a lung field, and to seek immediate medical attention should they occur. Patient verbalized understanding of these instructions and education.  Patient Consent Given: Yes Education handout provided: Previously provided Muscles treated: L proximal insertion of piriformis Electrical stimulation performed: No Parameters: N/A Treatment response/outcome: Twitch response ilicited Self massage with foam roller along L glute/piriformis   PATIENT EDUCATION:  Education details: Educated patient on anatomy and physiology of current symptoms, prognosis, plan of care as well as initial self care strategies to promote recovery; discussed using a Belly Band support Person educated: Patient Education method: Explanation Education comprehension: verbalized understanding  HOME EXERCISE PROGRAM: Access Code: [email or web address] URL: [email or web address] Date: 10/12/2022 Prepared by: Lavinia Sharps  Exercises - Seated Table Piriformis Stretch  - 1 x daily - 7 x weekly - 1 sets - 3 reps - 30 hold - Child's Pose with Sidebending  - 1 x daily - 7 x weekly - 1 sets - 3 reps - 30 hold - Quadruped Cat Cow  - 1 x daily - 7 x weekly - 1 sets - 10 reps - Seated Piriformis Stretch with Trunk Bend  - 1 x daily - 7 x weekly - 1 sets - 3 reps - 30 hold  ASSESSMENT:  CLINICAL IMPRESSION: Pt slightly apprehensive of TPDN -- discussed just performing trial of one needle today to see if pt able to tolerate well. Continued to work  on gentle stretching and strengthening of glutes. Went over self massage/myofacial release with ball and foam roller. Addressed her calf spasm with stretches this session.   From eval: Patient is a 37 y.o. female who was seen today for physical therapy evaluation and treatment for pregnancy related left buttock and posterior thigh pain extended to the knee.  Multiple  tender points identified in left gluteals.  Decreased motor control noted in left gluteals with compensatory pelvic drop and lateral trunk lean.  She would benefit from patient education; therapeutic ex, aquatic PT for buoyancy properties and manual interventions.     GOALS: Goals reviewed with patient? Yes  SHORT TERM GOALS: Target date: 11/23/2022    The patient will demonstrate knowledge of basic self care strategies and exercises to promote healing   Baseline: Goal status: INITIAL  2.  Patient will be able to walk medium distances with pain level 5/10 or less Baseline:  Goal status: INITIAL  3.  The patient will have improved hip strength to at least 4/5 needed for standing, walking longer distances and descending stairs at home and in the community  Baseline:  Goal status: INITIAL     LONG TERM GOALS: Target date: 01/04/2023    The patient will be independent in a safe self progression of a home exercise program and self care strategies to promote further recovery of function  Baseline:  Goal status: INITIAL  2.  Patient will be able to walk 20 min with pain 4/10 Baseline:  Goal status: INITIAL  3.  The patient will have improved hip strength to at least 4+/5 needed for standing, walking longer distances and descending stairs at home and in the community  Baseline:  Goal status: INITIAL  4.  The patient will have improved trunk flexor and extensor muscle strength to at least 4+/5 needed for lifting her future newborn and baby carrier Baseline:  Goal status: INITIAL  5.  Modified Oswestry improved to 46% Baseline:  Goal status: INITIAL   PLAN:  PT FREQUENCY: 1x/week  PT DURATION: 12 weeks  PLANNED INTERVENTIONS: Therapeutic exercises, Therapeutic activity, Neuromuscular re-education, Patient/Family education, Self Care, Joint mobilization, Aquatic Therapy, Dry Needling, Electrical stimulation, Cryotherapy, Moist heat, Taping, Manual therapy, and  Re-evaluation.  PLAN FOR NEXT SESSION: DN to left gluteals; ball ex's (pt interested in getting one for home; follow up on belly band;  piriformis and glute stretch review;  add clams; add standing lumbar extensions to HEP; aquatic PT  [facility] April Dell Ponto, PT 10/18/22 2:52 PM Phone: [phone] Fax: [phone]

## 2022-10-20 DIAGNOSIS — O99019 Anemia complicating pregnancy, unspecified trimester: Secondary | ICD-10-CM

## 2022-10-20 DIAGNOSIS — D573 Sickle-cell trait: Secondary | ICD-10-CM | POA: Insufficient documentation

## 2022-10-20 HISTORY — DX: Sickle-cell trait: O99.019

## 2022-10-20 HISTORY — DX: Sickle-cell trait: D57.3

## 2022-10-22 ENCOUNTER — Ambulatory Visit: Payer: Medicaid Other

## 2022-10-22 DIAGNOSIS — O99019 Anemia complicating pregnancy, unspecified trimester: Secondary | ICD-10-CM

## 2022-10-26 ENCOUNTER — Ambulatory Visit: Payer: Medicaid Other | Admitting: Rehabilitative and Restorative Service Providers"

## 2022-10-26 ENCOUNTER — Other Ambulatory Visit: Payer: Medicaid Other

## 2022-10-26 ENCOUNTER — Ambulatory Visit: Payer: Medicaid Other

## 2022-10-26 ENCOUNTER — Ambulatory Visit (INDEPENDENT_AMBULATORY_CARE_PROVIDER_SITE_OTHER): Payer: Medicaid Other | Admitting: Obstetrics and Gynecology

## 2022-10-26 VITALS — BP 106/65 | HR 83 | Wt 159.0 lb

## 2022-10-26 DIAGNOSIS — M5432 Sciatica, left side: Secondary | ICD-10-CM

## 2022-10-26 DIAGNOSIS — D573 Sickle-cell trait: Secondary | ICD-10-CM

## 2022-10-26 DIAGNOSIS — A6004 Herpesviral vulvovaginitis: Secondary | ICD-10-CM

## 2022-10-26 DIAGNOSIS — Z3A28 28 weeks gestation of pregnancy: Secondary | ICD-10-CM

## 2022-10-26 DIAGNOSIS — M6281 Muscle weakness (generalized): Secondary | ICD-10-CM

## 2022-10-26 DIAGNOSIS — R279 Unspecified lack of coordination: Secondary | ICD-10-CM

## 2022-10-26 DIAGNOSIS — M62838 Other muscle spasm: Secondary | ICD-10-CM

## 2022-10-26 DIAGNOSIS — O09523 Supervision of elderly multigravida, third trimester: Secondary | ICD-10-CM

## 2022-10-26 DIAGNOSIS — O99013 Anemia complicating pregnancy, third trimester: Secondary | ICD-10-CM

## 2022-10-26 DIAGNOSIS — Z3480 Encounter for supervision of other normal pregnancy, unspecified trimester: Secondary | ICD-10-CM

## 2022-10-26 NOTE — Progress Notes (Signed)
   PRENATAL VISIT NOTE  Subjective:  Teresa Burns is a 37 y.o. G2P1001 at [redacted]w[redacted]d being seen today for ongoing prenatal care.  She is currently monitored for the following issues for this low-risk pregnancy and has Herpes genitalis; Supervision of other normal pregnancy, antepartum; Advanced maternal age in multigravida; and Sickle cell trait in mother affecting pregnancy (HCC) on their problem list.  Patient doing well with no acute concerns today. She reports  pelvic pain at the symphysis .  Contractions: Not present. Vag. Bleeding: None.  Movement: Present. Denies leaking of fluid.   The following portions of the patient's history were reviewed and updated as appropriate: allergies, current medications, past family history, past medical history, past social history, past surgical history and problem list. Problem list updated.   Patient counseled regarding bilateral tubal ligation. Reviewed that this is a permanent procedure and that she will not be able to have children after it is done. Reviewed risks of bilateral tubal ligation including infection, hemorrhage, damage to surrounding tissue and organs, risk of regret. Reviewed that bilateral tubal ligation is not 100% effective and she should take a pregnancy test if she believes for any reason she may be pregnant. Reviewed slightly increased risk of ectopic pregnancy and need to seek care if she becomes pregnant. She understands this is an elective procedure and again affirms her desire. Consent signed.  Postpartum and interval tubal discussed in detail.  Pt thought she would get a scheduled c section to get a tubal, she was advised this was not the case.  Alternatives including nexplanon and IUD were also discussed as she wants to breast feed.  Pt advised I would poll the team regarding primary c section for third degree tear.   Objective:   Vitals:   10/26/22 0904  BP: 106/65  Pulse: 83  Weight: 159 lb (72.1 kg)    Fetal Status: Fetal  Heart Rate (bpm): 145 Fundal Height: 29 cm Movement: Present     General:  Alert, oriented and cooperative. Patient is in no acute distress.  Skin: Skin is warm and dry. No rash noted.   Cardiovascular: Normal heart rate noted  Respiratory: Normal respiratory effort, no problems with respiration noted  Abdomen: Soft, gravid, appropriate for gestational age.  Pain/Pressure: Present     Pelvic: Cervical exam deferred        Extremities: Normal range of motion.     Mental Status:  Normal mood and affect. Normal behavior. Normal judgment and thought content.   Assessment and Plan:  Pregnancy: G2P1001 at [redacted]w[redacted]d  1. [redacted] weeks gestation of pregnancy   2. Herpes simplex vulvovaginitis Prophylaxis at 36 weeks  3. Sickle cell trait in mother affecting pregnancy (HCC)   4. Supervision of other normal pregnancy, antepartum Continue routine prenatal care  - Glucose Tolerance, 2 Hours w/1 Hour - HIV Antibody (routine testing w rflx) - CBC - RPR  5. Multigravida of advanced maternal age in third trimester   Preterm labor symptoms and general obstetric precautions including but not limited to vaginal bleeding, contractions, leaking of fluid and fetal movement were reviewed in detail with the patient.  Please refer to After Visit Summary for other counseling recommendations.   Return in about 2 weeks (around 11/09/2022) for ROB, virtual.   Mariel Aloe, MD Faculty Attending [facility] for [facility]

## 2022-10-26 NOTE — Progress Notes (Signed)
Pt states she is having severe pain with pain at pubic bone.  Pt is in PT for sciatic pain.

## 2022-10-26 NOTE — Therapy (Signed)
OUTPATIENT PHYSICAL THERAPY THORACOLUMBAR EVALUATION   Patient Name: Teresa Burns MRN: [id number] DOB:1986/01/05, 37 y.o., female Today's Date: 10/26/2022  END OF SESSION:  PT End of Session - 10/26/22 1449     Visit Number 3    Date for PT Re-Evaluation 01/04/23    Authorization Type Medicaid    Authorization Time Period 10/12/2022-12/11/2022    Authorization - Visit Number 2    Authorization - Number of Visits 10    PT Start Time 1447    PT Stop Time 1528    PT Time Calculation (min) 41 min    Activity Tolerance Patient tolerated treatment well    Behavior During Therapy [facility] for tasks assessed/performed              Past Medical History:  Diagnosis Date   Herpes    Pleuritic chest pain 07/15/2015   Sickle cell trait (HCC)    Past Surgical History:  Procedure Laterality Date   NO PAST SURGERIES     Patient Active Problem List   Diagnosis Date Noted   Sickle cell trait in mother affecting pregnancy (HCC) 10/20/2022   Advanced maternal age in multigravida 10/08/2022   Supervision of other normal pregnancy, antepartum 06/08/2022   Herpes genitalis 07/15/2015     REFERRING PROVIDER: Mariel Aloe MD  REFERRING DIAG: O26.899, M79.606 pregnancy related leg pain, sciatica  Rationale for Evaluation and Treatment: Rehabilitation  THERAPY DIAG:  sciatica ONSET DATE: February 2024  SUBJECTIVE:                                                                                                                                                                                           SUBJECTIVE STATEMENT: Pt [redacted]w[redacted]d - Pt states that she is having pubic symphysis pain today; pain is worst when she is rolling around in bed at night. She has painful pressure when performing sit<>stand or walking as well. Standing on one leg to get dressed.   PERTINENT HISTORY:  Pregnant [redacted] weeks due in August; normal pregnancy no precautions Son is 40 years old Has access to a pool PAIN:   PAIN:  Are you having pain? Yes NPRS scale: 7/10   Pain location: some left hip pain, but mostly pubic symphysis Aggravating factors: bending over, getting in/out of car;  squatting, walking too fast Relieving factors: deep massage to piriformis, stretching but hard to get in positions now due to pregnancy   PRECAUTIONS: Other: pregnancy  WEIGHT BEARING RESTRICTIONS: No  FALLS:  Has patient fallen in last 6 months? No  LIVING ENVIRONMENT: Lives with: lives with their family Lives in: House/apartment  OCCUPATION: stay  at home  PLOF: Independent  PATIENT GOALS: pain relief for remainder of pregnancy    OBJECTIVE:   PATIENT SURVEYS:  Modified Oswestry 56% self perceived disability   COGNITION: Overall cognitive status: Within functional limits for tasks assessed    PALPATION: Multiple tender points left gluteals  LUMBAR ROM:   AROM eval  Flexion WFLS  Extension 15  Right lateral flexion 30  Left lateral flexion 30  Right rotation   Left rotation    (Blank rows = not tested)  TRUNK STRENGTH:  Decreased activation of transverse abdominus muscles; abdominals 4-/5; decreased activation of lumbar multifidi; trunk extensors 4-/5  LOWER EXTREMITY ROM:   WFLS  LOWER EXTREMITY MMT:   Left hip abduction 3+/5; left knee extension 4-/5, left knee flexion 4-/5  FUNCTIONAL TESTS:  Unable to rise from sit to stand without moderate UE assist Pelvic drop on left and lateral trunk lean with SLS  Negative slump test GAIT:  Comments: antalgic, decreased gait speed  TODAY'S TREATMENT:                                                                                                                              DATE:  10/26/22  (PFPT session) Neuromuscular re-education: Transversus abdominus training with multimodal cues for improved motor control and breath coordination Seated pelvic tilts with core activation and breath coordination  Seated UE ball press with core  activation 10x Seated unilateral UE ball press with core activation 10x bil Seated hip adduction ball squeeze 10x Seated hip abduction black band 10x Therapeutic activities: Bed mobility training with core activation Pelvic stability belt (motif) trial and instruction Urge drill   10/18/22 Nustep L4 x 5 min LEs/UEs S/L Clamshell 2x10 S/L pillow squeeze + pelvic floor contraction 2x10 Supine hamstring stretch with strap 2x30 sec Supine ITB stretch with strap 2x30 sec Supine gastroc stretch x30 sec Supine calf stretch with strap x30 sec Standing gastroc stretch x 30 sec Standing soleus stretch x 30 sec Supine 90/90 sciatic nerve glide x10 Seated piriformis stretch x30 sec STM & TPR L glute/piriformis  Skilled assessment and palpation for TPDN Trigger Point Dry-Needling  Treatment instructions: Expect mild to moderate muscle soreness. S/S of pneumothorax if dry needled over a lung field, and to seek immediate medical attention should they occur. Patient verbalized understanding of these instructions and education.  Patient Consent Given: Yes Education handout provided: Previously provided Muscles treated: L proximal insertion of piriformis Electrical stimulation performed: No Parameters: N/A Treatment response/outcome: Twitch response ilicited Self massage with foam roller along L glute/piriformis   PATIENT EDUCATION:  Education details: Educated patient on anatomy and physiology of current symptoms, prognosis, plan of care as well as initial self care strategies to promote recovery; discussed using a Belly Band support Person educated: Patient Education method: Explanation Education comprehension: verbalized understanding  HOME EXERCISE PROGRAM: Access Code: [email or web address] URL: [email or web address] Date: 10/12/2022 Prepared by: Lavinia Sharps  Exercises - Seated Table Piriformis Stretch  -  1 x daily - 7 x weekly - 1 sets - 3 reps - 30 hold - Child's Pose with  Sidebending  - 1 x daily - 7 x weekly - 1 sets - 3 reps - 30 hold - Quadruped Cat Cow  - 1 x daily - 7 x weekly - 1 sets - 10 reps - Seated Piriformis Stretch with Trunk Bend  - 1 x daily - 7 x weekly - 1 sets - 3 reps - 30 hold  ASSESSMENT:  CLINICAL IMPRESSION: Core training performed with pt today and incorporated into pelvic stability exercises and bed mobility training. She did very well with this and reported improved comfort with these activities. We discussed importance of coordinating breathing with activity in order to manage abdominal pressure and assist with appropriate core activation. Due to urinary urgency, we went over the urge drill and discussed how to utilize during the day and at night. She will continue to benefit from skilled PT intervention in order to provide management techniques and implement functional strengthening program appropriate for pregnancy.   From eval: Patient is a 37 y.o. female who was seen today for physical therapy evaluation and treatment for pregnancy related left buttock and posterior thigh pain extended to the knee.  Multiple tender points identified in left gluteals.  Decreased motor control noted in left gluteals with compensatory pelvic drop and lateral trunk lean.  She would benefit from patient education; therapeutic ex, aquatic PT for buoyancy properties and manual interventions.     GOALS: Goals reviewed with patient? Yes  SHORT TERM GOALS: Target date: 11/23/2022    The patient will demonstrate knowledge of basic self care strategies and exercises to promote healing   Baseline: Goal status: INITIAL  2.  Patient will be able to walk medium distances with pain level 5/10 or less Baseline:  Goal status: INITIAL  3.  The patient will have improved hip strength to at least 4/5 needed for standing, walking longer distances and descending stairs at home and in the community  Baseline:  Goal status: INITIAL     LONG TERM GOALS: Target  date: 01/04/2023    The patient will be independent in a safe self progression of a home exercise program and self care strategies to promote further recovery of function  Baseline:  Goal status: INITIAL  2.  Patient will be able to walk 20 min with pain 4/10 Baseline:  Goal status: INITIAL  3.  The patient will have improved hip strength to at least 4+/5 needed for standing, walking longer distances and descending stairs at home and in the community  Baseline:  Goal status: INITIAL  4.  The patient will have improved trunk flexor and extensor muscle strength to at least 4+/5 needed for lifting her future newborn and baby carrier Baseline:  Goal status: INITIAL  5.  Modified Oswestry improved to 46% Baseline:  Goal status: INITIAL   PLAN:  PT FREQUENCY: 1x/week  PT DURATION: 12 weeks  PLANNED INTERVENTIONS: Therapeutic exercises, Therapeutic activity, Neuromuscular re-education, Patient/Family education, Self Care, Joint mobilization, Aquatic Therapy, Dry Needling, Electrical stimulation, Cryotherapy, Moist heat, Taping, Manual therapy, and Re-evaluation.  PLAN FOR NEXT SESSION: Aquatic PT; continue to progress pelvic stability exercises; reinforce concepts of breathing with movement for better pressure management  Julio Alm, PT, DPT05/28/243:35 PM

## 2022-10-27 LAB — HIV ANTIBODY (ROUTINE TESTING W REFLEX): HIV Screen 4th Generation wRfx: NONREACTIVE

## 2022-10-27 LAB — RPR: RPR Ser Ql: NONREACTIVE

## 2022-10-27 LAB — CBC
Hematocrit: 31.1 % — ABNORMAL LOW (ref 34.0–46.6)
Hemoglobin: 10.8 g/dL — ABNORMAL LOW (ref 11.1–15.9)
MCH: 31.3 pg (ref 26.6–33.0)
MCHC: 34.7 g/dL (ref 31.5–35.7)
MCV: 90 fL (ref 79–97)
Platelets: 178 10*3/uL (ref 150–450)
RBC: 3.45 x10E6/uL — ABNORMAL LOW (ref 3.77–5.28)
RDW: 12.5 % (ref 11.7–15.4)
WBC: 8.1 10*3/uL (ref 3.4–10.8)

## 2022-10-27 LAB — GLUCOSE TOLERANCE, 2 HOURS W/ 1HR
Glucose, 1 hour: 141 mg/dL (ref 70–179)
Glucose, 2 hour: 101 mg/dL (ref 70–152)
Glucose, Fasting: 85 mg/dL (ref 70–91)

## 2022-11-02 NOTE — Therapy (Unsigned)
OUTPATIENT PHYSICAL THERAPY THORACOLUMBAR EVALUATION   Patient Name: Teresa Burns MRN: [id number] DOB:Jul 26, 1985, 37 y.o., female Today's Date: 11/02/2022  END OF SESSION:     Past Medical History:  Diagnosis Date   Herpes    Pleuritic chest pain 07/15/2015   Sickle cell trait [facility])    Past Surgical History:  Procedure Laterality Date   NO PAST SURGERIES     Patient Active Problem List   Diagnosis Date Noted   Sickle cell trait in mother affecting pregnancy (HCC) 10/20/2022   Advanced maternal age in multigravida 10/08/2022   Supervision of other normal pregnancy, antepartum 06/08/2022   Herpes genitalis 07/15/2015     REFERRING PROVIDER: Mariel Aloe MD  REFERRING DIAG: O26.899, M79.606 pregnancy related leg pain, sciatica  Rationale for Evaluation and Treatment: Rehabilitation  THERAPY DIAG:  sciatica ONSET DATE: February 2024  SUBJECTIVE:                                                                                                                                                                                           SUBJECTIVE STATEMENT:    PERTINENT HISTORY:  Pregnant [redacted] weeks due in August; normal pregnancy no precautions Son is 43 years old Has access to a pool PAIN:  PAIN:  Are you having pain? Yes NPRS scale: 7/10   Pain location: some left hip pain, but mostly pubic symphysis Aggravating factors: bending over, getting in/out of car;  squatting, walking too fast Relieving factors: deep massage to piriformis, stretching but hard to get in positions now due to pregnancy   PRECAUTIONS: Other: pregnancy  WEIGHT BEARING RESTRICTIONS: No  FALLS:  Has patient fallen in last 6 months? No  LIVING ENVIRONMENT: Lives with: lives with their family Lives in: House/apartment  OCCUPATION: stay at home  PLOF: Independent  PATIENT GOALS: pain relief for remainder of pregnancy    OBJECTIVE:   PATIENT SURVEYS:  Modified Oswestry 56% self  perceived disability   COGNITION: Overall cognitive status: Within functional limits for tasks assessed    PALPATION: Multiple tender points left gluteals  LUMBAR ROM:   AROM eval  Flexion WFLS  Extension 15  Right lateral flexion 30  Left lateral flexion 30  Right rotation   Left rotation    (Blank rows = not tested)  TRUNK STRENGTH:  Decreased activation of transverse abdominus muscles; abdominals 4-/5; decreased activation of lumbar multifidi; trunk extensors 4-/5  LOWER EXTREMITY ROM:   WFLS  LOWER EXTREMITY MMT:   Left hip abduction 3+/5; left knee extension 4-/5, left knee flexion 4-/5  FUNCTIONAL TESTS:  Unable to rise from sit to stand without moderate  UE assist Pelvic drop on left and lateral trunk lean with SLS  Negative slump test GAIT:  Comments: antalgic, decreased gait speed  TODAY'S TREATMENT:                                                                                                                              DATE:   11/02/22:Pt arrives for aquatic physical therapy. Treatment took place in 3.5-5.5 feet of water. Water temperature was 90 degrees F. Pt entered the pool via stairs independently with use of handrails.  Seated water bench with 75% submersion Pt performed seated LE AROM exercises 20x in all planes, with concurrent discussion of water principles and how we will use them. Pt with verbal understanding.  10/26/22  (PFPT session) Neuromuscular re-education: Transversus abdominus training with multimodal cues for improved motor control and breath coordination Seated pelvic tilts with core activation and breath coordination  Seated UE ball press with core activation 10x Seated unilateral UE ball press with core activation 10x bil Seated hip adduction ball squeeze 10x Seated hip abduction black band 10x Therapeutic activities: Bed mobility training with core activation Pelvic stability belt (motif) trial and instruction Urge  drill   10/18/22 Nustep L4 x 5 min LEs/UEs S/L Clamshell 2x10 S/L pillow squeeze + pelvic floor contraction 2x10 Supine hamstring stretch with strap 2x30 sec Supine ITB stretch with strap 2x30 sec Supine gastroc stretch x30 sec Supine calf stretch with strap x30 sec Standing gastroc stretch x 30 sec Standing soleus stretch x 30 sec Supine 90/90 sciatic nerve glide x10 Seated piriformis stretch x30 sec STM & TPR L glute/piriformis  Skilled assessment and palpation for TPDN Trigger Point Dry-Needling  Treatment instructions: Expect mild to moderate muscle soreness. S/S of pneumothorax if dry needled over a lung field, and to seek immediate medical attention should they occur. Patient verbalized understanding of these instructions and education.  Patient Consent Given: Yes Education handout provided: Previously provided Muscles treated: L proximal insertion of piriformis Electrical stimulation performed: No Parameters: N/A Treatment response/outcome: Twitch response ilicited Self massage with foam roller along L glute/piriformis   PATIENT EDUCATION:  Education details: Educated patient on anatomy and physiology of current symptoms, prognosis, plan of care as well as initial self care strategies to promote recovery; discussed using a Belly Band support Person educated: Patient Education method: Explanation Education comprehension: verbalized understanding  HOME EXERCISE PROGRAM: Access Code: [email or web address] URL: [email or web address] Date: 10/12/2022 Prepared by: Lavinia Sharps  Exercises - Seated Table Piriformis Stretch  - 1 x daily - 7 x weekly - 1 sets - 3 reps - 30 hold - Child's Pose with Sidebending  - 1 x daily - 7 x weekly - 1 sets - 3 reps - 30 hold - Quadruped Cat Cow  - 1 x daily - 7 x weekly - 1 sets - 10 reps - Seated Piriformis Stretch with Trunk Bend  - 1 x daily - 7 x weekly - 1 sets -  3 reps - 30 hold  ASSESSMENT:  CLINICAL  IMPRESSION:  GOALS: Goals reviewed with patient? Yes  SHORT TERM GOALS: Target date: 11/23/2022    The patient will demonstrate knowledge of basic self care strategies and exercises to promote healing   Baseline: Goal status: INITIAL  2.  Patient will be able to walk medium distances with pain level 5/10 or less Baseline:  Goal status: INITIAL  3.  The patient will have improved hip strength to at least 4/5 needed for standing, walking longer distances and descending stairs at home and in the community  Baseline:  Goal status: INITIAL     LONG TERM GOALS: Target date: 01/04/2023    The patient will be independent in a safe self progression of a home exercise program and self care strategies to promote further recovery of function  Baseline:  Goal status: INITIAL  2.  Patient will be able to walk 20 min with pain 4/10 Baseline:  Goal status: INITIAL  3.  The patient will have improved hip strength to at least 4+/5 needed for standing, walking longer distances and descending stairs at home and in the community  Baseline:  Goal status: INITIAL  4.  The patient will have improved trunk flexor and extensor muscle strength to at least 4+/5 needed for lifting her future newborn and baby carrier Baseline:  Goal status: INITIAL  5.  Modified Oswestry improved to 46% Baseline:  Goal status: INITIAL   PLAN:  PT FREQUENCY: 1x/week  PT DURATION: 12 weeks  PLANNED INTERVENTIONS: Therapeutic exercises, Therapeutic activity, Neuromuscular re-education, Patient/Family education, Self Care, Joint mobilization, Aquatic Therapy, Dry Needling, Electrical stimulation, Cryotherapy, Moist heat, Taping, Manual therapy, and Re-evaluation.  PLAN FOR NEXT SESSION: Aquatic PT; continue to progress pelvic stability exercises; reinforce concepts of breathing with movement for better pressure management  Ane Payment, PTA 11/02/22 8:47 PM

## 2022-11-03 ENCOUNTER — Ambulatory Visit: Payer: Medicaid Other | Attending: Obstetrics and Gynecology | Admitting: Physical Therapy

## 2022-11-03 ENCOUNTER — Encounter: Payer: Self-pay | Admitting: Physical Therapy

## 2022-11-03 DIAGNOSIS — M6281 Muscle weakness (generalized): Secondary | ICD-10-CM | POA: Diagnosis present

## 2022-11-03 DIAGNOSIS — R279 Unspecified lack of coordination: Secondary | ICD-10-CM | POA: Diagnosis present

## 2022-11-03 DIAGNOSIS — M5432 Sciatica, left side: Secondary | ICD-10-CM

## 2022-11-03 DIAGNOSIS — M62838 Other muscle spasm: Secondary | ICD-10-CM | POA: Diagnosis present

## 2022-11-08 ENCOUNTER — Telehealth: Payer: Self-pay | Admitting: Physical Therapy

## 2022-11-08 ENCOUNTER — Ambulatory Visit: Payer: Medicaid Other | Admitting: Physical Therapy

## 2022-11-08 NOTE — Telephone Encounter (Signed)
Called pt to check in on her due to no-show to today's appointment. No answer and unable to leave a voicemail on her cell phone. Pt's voicemail has not been set up.  Maxton Noreen April Dell Ponto, PT 3:19 PM, 11/08/2022

## 2022-11-08 NOTE — Therapy (Deleted)
OUTPATIENT PHYSICAL THERAPY THORACOLUMBAR    Patient Name: Teresa Burns MRN: [id number] DOB:12/20/1985, 37 y.o., female Today's Date: 11/08/2022  END OF SESSION:      Past Medical History:  Diagnosis Date   Herpes    Pleuritic chest pain 07/15/2015   Sickle cell trait [facility])    Past Surgical History:  Procedure Laterality Date   NO PAST SURGERIES     Patient Active Problem List   Diagnosis Date Noted   Sickle cell trait in mother affecting pregnancy (HCC) 10/20/2022   Advanced maternal age in multigravida 10/08/2022   Supervision of other normal pregnancy, antepartum 06/08/2022   Herpes genitalis 07/15/2015     REFERRING PROVIDER: Mariel Aloe MD  REFERRING DIAG: O26.899, M79.606 pregnancy related leg pain, sciatica  Rationale for Evaluation and Treatment: Rehabilitation  THERAPY DIAG:  sciatica ONSET DATE: February 2024  SUBJECTIVE:                                                                                                                                                                                           SUBJECTIVE STATEMENT: *** Rough night last night, I can feel my pubis shifting. I enjoy being in the water for exercise and relaxation.    PERTINENT HISTORY:  Pregnant [redacted] weeks due in August; normal pregnancy no precautions Son is 34 years old Has access to a pool PAIN:  PAIN:  Are you having pain? Yes NPRS scale: 7/10   Pain location: some left hip pain, but mostly pubic symphysis Aggravating factors: bending over, getting in/out of car;  squatting, walking too fast Relieving factors: deep massage to piriformis, stretching but hard to get in positions now due to pregnancy   PRECAUTIONS: Other: pregnancy  WEIGHT BEARING RESTRICTIONS: No  FALLS:  Has patient fallen in last 6 months? No  LIVING ENVIRONMENT: Lives with: lives with their family Lives in: House/apartment  OCCUPATION: stay at home  PLOF: Independent  PATIENT GOALS:  pain relief for remainder of pregnancy    OBJECTIVE:   PATIENT SURVEYS:  Modified Oswestry 56% self perceived disability   COGNITION: Overall cognitive status: Within functional limits for tasks assessed    PALPATION: Multiple tender points left gluteals  LUMBAR ROM:   AROM eval  Flexion WFLS  Extension 15  Right lateral flexion 30  Left lateral flexion 30  Right rotation   Left rotation    (Blank rows = not tested)  TRUNK STRENGTH:  Decreased activation of transverse abdominus muscles; abdominals 4-/5; decreased activation of lumbar multifidi; trunk extensors 4-/5  LOWER EXTREMITY ROM:   WFLS  LOWER EXTREMITY MMT:   Left hip abduction  3+/5; left knee extension 4-/5, left knee flexion 4-/5  FUNCTIONAL TESTS:  Unable to rise from sit to stand without moderate UE assist Pelvic drop on left and lateral trunk lean with SLS  Negative slump test GAIT:  Comments: antalgic, decreased gait speed  TODAY'S TREATMENT:                                                                                                                              DATE:  11/08/22:    11/02/22:Pt arrives for aquatic physical therapy. Treatment took place in 3.5-5.5 feet of water. Water temperature was 91 degrees F. Pt entered the pool via stairs independently with use of handrails.  Seated water bench with 75% submersion Pt performed seated LE AROM exercises 20x in all planes, with concurrent discussion of water principles and how we will use them. Pt with verbal understanding. Also discussed and physically practiced proper breathing with our exercises and how they correlate with first term labor breathing.Semi-sit at bench for gentle arm extension with exhale and gentle core contraction 2x5. This was followed by bil seated figure 4 piriformis stretch1x Rt 2x LT holding for 3 breaths.  Horizontal decompression with neck support 2 min followed by vertical decompression float with yellow noodle. Verbally  educated pt is how to replicate at her pool using both noodle and aqua jogger. Pt verbally understood. 75% depth for Post pelvic tilt against wall 2x5. 4 lengths of forward walking holding large noodle for postural support. 3 min decompression float to end.  10/26/22  (PFPT session) Neuromuscular re-education: Transversus abdominus training with multimodal cues for improved motor control and breath coordination Seated pelvic tilts with core activation and breath coordination  Seated UE ball press with core activation 10x Seated unilateral UE ball press with core activation 10x bil Seated hip adduction ball squeeze 10x Seated hip abduction black band 10x Therapeutic activities: Bed mobility training with core activation Pelvic stability belt (motif) trial and instruction Urge drill   10/18/22 Nustep L4 x 5 min LEs/UEs S/L Clamshell 2x10 S/L pillow squeeze + pelvic floor contraction 2x10 Supine hamstring stretch with strap 2x30 sec Supine ITB stretch with strap 2x30 sec Supine gastroc stretch x30 sec Supine calf stretch with strap x30 sec Standing gastroc stretch x 30 sec Standing soleus stretch x 30 sec Supine 90/90 sciatic nerve glide x10 Seated piriformis stretch x30 sec STM & TPR L glute/piriformis  Skilled assessment and palpation for TPDN Trigger Point Dry-Needling  Treatment instructions: Expect mild to moderate muscle soreness. S/S of pneumothorax if dry needled over a lung field, and to seek immediate medical attention should they occur. Patient verbalized understanding of these instructions and education.  Patient Consent Given: Yes Education handout provided: Previously provided Muscles treated: L proximal insertion of piriformis Electrical stimulation performed: No Parameters: N/A Treatment response/outcome: Twitch response ilicited Self massage with foam roller along L glute/piriformis   PATIENT EDUCATION:  Education details: Educated patient on anatomy and  physiology of current symptoms, prognosis, plan of care as well as initial self care strategies to promote recovery; discussed using a Belly Band support Person educated: Patient Education method: Explanation Education comprehension: verbalized understanding  HOME EXERCISE PROGRAM: Access Code: [email or web address] URL: [email or web address] Date: 10/12/2022 Prepared by: Lavinia Sharps  Exercises - Seated Table Piriformis Stretch  - 1 x daily - 7 x weekly - 1 sets - 3 reps - 30 hold - Child's Pose with Sidebending  - 1 x daily - 7 x weekly - 1 sets - 3 reps - 30 hold - Quadruped Cat Cow  - 1 x daily - 7 x weekly - 1 sets - 10 reps - Seated Piriformis Stretch with Trunk Bend  - 1 x daily - 7 x weekly - 1 sets - 3 reps - 30 hold  ASSESSMENT:  CLINICAL IMPRESSION: *** Pt arrives for first time aquatic PT treatment with moderate+ pain in her pelvis and a more mild version in her Lt buttocks. Pt enjoys moving in the water and has access to a community pool. She was educated in water principles and how to use them. Pt finds good relief with seated and horizontal decompression shapes. Pt did not report any pain increases while exercising in the water. Lt hip ext rotation stretch was challenging.   GOALS: Goals reviewed with patient? Yes  SHORT TERM GOALS: Target date: 11/23/2022    The patient will demonstrate knowledge of basic self care strategies and exercises to promote healing   Baseline: Goal status: INITIAL  2.  Patient will be able to walk medium distances with pain level 5/10 or less Baseline:  Goal status: INITIAL  3.  The patient will have improved hip strength to at least 4/5 needed for standing, walking longer distances and descending stairs at home and in the community  Baseline:  Goal status: INITIAL     LONG TERM GOALS: Target date: 01/04/2023    The patient will be independent in a safe self progression of a home exercise program and self care strategies to  promote further recovery of function  Baseline:  Goal status: INITIAL  2.  Patient will be able to walk 20 min with pain 4/10 Baseline:  Goal status: INITIAL  3.  The patient will have improved hip strength to at least 4+/5 needed for standing, walking longer distances and descending stairs at home and in the community  Baseline:  Goal status: INITIAL  4.  The patient will have improved trunk flexor and extensor muscle strength to at least 4+/5 needed for lifting her future newborn and baby carrier Baseline:  Goal status: INITIAL  5.  Modified Oswestry improved to 46% Baseline:  Goal status: INITIAL   PLAN:  PT FREQUENCY: 1x/week  PT DURATION: 12 weeks  PLANNED INTERVENTIONS: Therapeutic exercises, Therapeutic activity, Neuromuscular re-education, Patient/Family education, Self Care, Joint mobilization, Aquatic Therapy, Dry Needling, Electrical stimulation, Cryotherapy, Moist heat, Taping, Manual therapy, and Re-evaluation.  PLAN FOR NEXT SESSION: Aquatic PT; continue to progress pelvic stability exercises; reinforce concepts of breathing with movement for better pressure management  Teondra [place] April Ma L [place], PT 11/08/22 2:00 PM

## 2022-11-11 ENCOUNTER — Ambulatory Visit: Payer: Medicaid Other | Attending: Obstetrics and Gynecology | Admitting: *Deleted

## 2022-11-11 ENCOUNTER — Ambulatory Visit (HOSPITAL_BASED_OUTPATIENT_CLINIC_OR_DEPARTMENT_OTHER): Payer: Medicaid Other

## 2022-11-11 ENCOUNTER — Ambulatory Visit (INDEPENDENT_AMBULATORY_CARE_PROVIDER_SITE_OTHER): Payer: Medicaid Other | Admitting: Obstetrics and Gynecology

## 2022-11-11 VITALS — BP 138/78 | HR 101 | Wt 169.0 lb

## 2022-11-11 DIAGNOSIS — O09523 Supervision of elderly multigravida, third trimester: Secondary | ICD-10-CM | POA: Insufficient documentation

## 2022-11-11 DIAGNOSIS — A6004 Herpesviral vulvovaginitis: Secondary | ICD-10-CM

## 2022-11-11 DIAGNOSIS — B009 Herpesviral infection, unspecified: Secondary | ICD-10-CM

## 2022-11-11 DIAGNOSIS — O98513 Other viral diseases complicating pregnancy, third trimester: Secondary | ICD-10-CM | POA: Insufficient documentation

## 2022-11-11 DIAGNOSIS — Z3A30 30 weeks gestation of pregnancy: Secondary | ICD-10-CM

## 2022-11-11 DIAGNOSIS — D573 Sickle-cell trait: Secondary | ICD-10-CM

## 2022-11-11 DIAGNOSIS — O09522 Supervision of elderly multigravida, second trimester: Secondary | ICD-10-CM | POA: Diagnosis not present

## 2022-11-11 DIAGNOSIS — Z3480 Encounter for supervision of other normal pregnancy, unspecified trimester: Secondary | ICD-10-CM

## 2022-11-11 DIAGNOSIS — O285 Abnormal chromosomal and genetic finding on antenatal screening of mother: Secondary | ICD-10-CM

## 2022-11-11 DIAGNOSIS — O98413 Viral hepatitis complicating pregnancy, third trimester: Secondary | ICD-10-CM

## 2022-11-11 DIAGNOSIS — O99019 Anemia complicating pregnancy, unspecified trimester: Secondary | ICD-10-CM

## 2022-11-11 NOTE — Progress Notes (Signed)
   PRENATAL VISIT NOTE  Subjective:  Teresa Burns is a 37 y.o. G2P1001 at [redacted]w[redacted]d being seen today for ongoing prenatal care.  She is currently monitored for the following issues for this low-risk pregnancy and has Herpes genitalis; Supervision of other normal pregnancy, antepartum; Advanced maternal age in multigravida; and Sickle cell trait in mother affecting pregnancy (HCC) on their problem list.  Patient reports  tingling in left hand .  Contractions: Not present. Vag. Bleeding: None.  Movement: Present. Denies leaking of fluid.   The following portions of the patient's history were reviewed and updated as appropriate: allergies, current medications, past family history, past medical history, past social history, past surgical history and problem list.   Objective:   Vitals:   11/11/22 1353  BP: 138/78  Pulse: (!) 101  Weight: 169 lb (76.7 kg)    Fetal Status: Fetal Heart Rate (bpm): 146 Fundal Height: 31 cm Movement: Present     General:  Alert, oriented and cooperative. Patient is in no acute distress.  Skin: Skin is warm and dry. No rash noted.   Cardiovascular: Normal heart rate noted  Respiratory: Normal respiratory effort, no problems with respiration noted  Abdomen: Soft, gravid, appropriate for gestational age.  Pain/Pressure: Present     Pelvic: Cervical exam deferred        Extremities: Normal range of motion.  Edema: None  Mental Status: Normal mood and affect. Normal behavior. Normal judgment and thought content.   Assessment and Plan:  Pregnancy: G2P1001 at [redacted]w[redacted]d  1. Supervision of other normal pregnancy, antepartum  2. Multigravida of advanced maternal age in third trimester  3. Sickle cell trait in mother affecting pregnancy (HCC)  4. Herpes simplex vulvovaginitis Ppx at 34-36 weeks  5. [redacted] weeks gestation of pregnancy   Preterm labor symptoms and general obstetric precautions including but not limited to vaginal bleeding, contractions, leaking of  fluid and fetal movement were reviewed in detail with the patient. Please refer to After Visit Summary for other counseling recommendations.   Return in about 2 weeks (around 11/25/2022) for low OB.  Future Appointments  Date Time Provider Department Center  11/15/2022  2:45 PM Benjie Karvonen April Ma L, PT [facility] None  11/22/2022  9:30 AM Menke, [place], PT [facility] None  11/29/2022 11:00 AM Menke, [place], PT [facility] None  12/06/2022 11:00 AM Menke, [place], PT [facility] None    Conan Bowens, MD

## 2022-11-15 ENCOUNTER — Telehealth: Payer: Self-pay | Admitting: Physical Therapy

## 2022-11-15 ENCOUNTER — Ambulatory Visit: Payer: Medicaid Other | Admitting: Physical Therapy

## 2022-11-15 NOTE — Therapy (Deleted)
OUTPATIENT PHYSICAL THERAPY THORACOLUMBAR    Patient Name: Teresa Burns MRN: [id number] DOB:30-Mar-1986, 37 y.o., female Today's Date: 11/15/2022  END OF SESSION:      Past Medical History:  Diagnosis Date   Herpes    Pleuritic chest pain 07/15/2015   Sickle cell trait [facility])    Past Surgical History:  Procedure Laterality Date   NO PAST SURGERIES     Patient Active Problem List   Diagnosis Date Noted   Sickle cell trait in mother affecting pregnancy (HCC) 10/20/2022   Advanced maternal age in multigravida 10/08/2022   Supervision of other normal pregnancy, antepartum 06/08/2022   Herpes genitalis 07/15/2015     REFERRING PROVIDER: Mariel Aloe MD  REFERRING DIAG: O26.899, M79.606 pregnancy related leg pain, sciatica  Rationale for Evaluation and Treatment: Rehabilitation  THERAPY DIAG:  sciatica ONSET DATE: February 2024  SUBJECTIVE:                                                                                                                                                                                           SUBJECTIVE STATEMENT: *** Rough night last night, I can feel my pubis shifting. I enjoy being in the water for exercise and relaxation.    PERTINENT HISTORY:  Pregnant [redacted] weeks due in August; normal pregnancy no precautions Son is 77 years old Has access to a pool PAIN:  Are you having pain? Yes NPRS scale: 7/10   Pain location: some left hip pain, but mostly pubic symphysis Aggravating factors: bending over, getting in/out of car;  squatting, walking too fast Relieving factors: deep massage to piriformis, stretching but hard to get in positions now due to pregnancy   PRECAUTIONS: Other: pregnancy  WEIGHT BEARING RESTRICTIONS: No  FALLS:  Has patient fallen in last 6 months? No  LIVING ENVIRONMENT: Lives with: lives with their family Lives in: House/apartment  OCCUPATION: stay at home  PLOF: Independent  PATIENT GOALS: pain  relief for remainder of pregnancy    OBJECTIVE:   PATIENT SURVEYS:  Modified Oswestry 56% self perceived disability   COGNITION: Overall cognitive status: Within functional limits for tasks assessed    PALPATION: Multiple tender points left gluteals  LUMBAR ROM:   AROM eval  Flexion WFLS  Extension 15  Right lateral flexion 30  Left lateral flexion 30  Right rotation   Left rotation    (Blank rows = not tested)  TRUNK STRENGTH:  Decreased activation of transverse abdominus muscles; abdominals 4-/5; decreased activation of lumbar multifidi; trunk extensors 4-/5  LOWER EXTREMITY ROM:   WFLS  LOWER EXTREMITY MMT:   Left hip abduction 3+/5; left  knee extension 4-/5, left knee flexion 4-/5  FUNCTIONAL TESTS:  Unable to rise from sit to stand without moderate UE assist Pelvic drop on left and lateral trunk lean with SLS  Negative slump test GAIT:  Comments: antalgic, decreased gait speed  TODAY'S TREATMENT:                                                                                                                              DATE:  11/15/22: ***  11/02/22:Pt arrives for aquatic physical therapy. Treatment took place in 3.5-5.5 feet of water. Water temperature was 91 degrees F. Pt entered the pool via stairs independently with use of handrails.  Seated water bench with 75% submersion Pt performed seated LE AROM exercises 20x in all planes, with concurrent discussion of water principles and how we will use them. Pt with verbal understanding. Also discussed and physically practiced proper breathing with our exercises and how they correlate with first term labor breathing.Semi-sit at bench for gentle arm extension with exhale and gentle core contraction 2x5. This was followed by bil seated figure 4 piriformis stretch1x Rt 2x LT holding for 3 breaths.  Horizontal decompression with neck support 2 min followed by vertical decompression float with yellow noodle. Verbally  educated pt is how to replicate at her pool using both noodle and aqua jogger. Pt verbally understood. 75% depth for Post pelvic tilt against wall 2x5. 4 lengths of forward walking holding large noodle for postural support. 3 min decompression float to end.  10/26/22  (PFPT session) Neuromuscular re-education: Transversus abdominus training with multimodal cues for improved motor control and breath coordination Seated pelvic tilts with core activation and breath coordination  Seated UE ball press with core activation 10x Seated unilateral UE ball press with core activation 10x bil Seated hip adduction ball squeeze 10x Seated hip abduction black band 10x Therapeutic activities: Bed mobility training with core activation Pelvic stability belt (motif) trial and instruction Urge drill   10/18/22 Nustep L4 x 5 min LEs/UEs S/L Clamshell 2x10 S/L pillow squeeze + pelvic floor contraction 2x10 Supine hamstring stretch with strap 2x30 sec Supine ITB stretch with strap 2x30 sec Supine gastroc stretch x30 sec Supine calf stretch with strap x30 sec Standing gastroc stretch x 30 sec Standing soleus stretch x 30 sec Supine 90/90 sciatic nerve glide x10 Seated piriformis stretch x30 sec STM & TPR L glute/piriformis  Skilled assessment and palpation for TPDN Trigger Point Dry-Needling  Treatment instructions: Expect mild to moderate muscle soreness. S/S of pneumothorax if dry needled over a lung field, and to seek immediate medical attention should they occur. Patient verbalized understanding of these instructions and education.  Patient Consent Given: Yes Education handout provided: Previously provided Muscles treated: L proximal insertion of piriformis Electrical stimulation performed: No Parameters: N/A Treatment response/outcome: Twitch response ilicited Self massage with foam roller along L glute/piriformis   PATIENT EDUCATION:  Education details: Educated patient on anatomy and  physiology of current  symptoms, prognosis, plan of care as well as initial self care strategies to promote recovery; discussed using a Belly Band support Person educated: Patient Education method: Explanation Education comprehension: verbalized understanding  HOME EXERCISE PROGRAM: Access Code: [email or web address] URL: [email or web address] Date: 10/12/2022 Prepared by: Lavinia Sharps  Exercises - Seated Table Piriformis Stretch  - 1 x daily - 7 x weekly - 1 sets - 3 reps - 30 hold - Child's Pose with Sidebending  - 1 x daily - 7 x weekly - 1 sets - 3 reps - 30 hold - Quadruped Cat Cow  - 1 x daily - 7 x weekly - 1 sets - 10 reps - Seated Piriformis Stretch with Trunk Bend  - 1 x daily - 7 x weekly - 1 sets - 3 reps - 30 hold  ASSESSMENT:  CLINICAL IMPRESSION: *** Pt arrives for first time aquatic PT treatment with moderate+ pain in her pelvis and a more mild version in her Lt buttocks. Pt enjoys moving in the water and has access to a community pool. She was educated in water principles and how to use them. Pt finds good relief with seated and horizontal decompression shapes. Pt did not report any pain increases while exercising in the water. Lt hip ext rotation stretch was challenging.   GOALS: Goals reviewed with patient? Yes  SHORT TERM GOALS: Target date: 11/23/2022    The patient will demonstrate knowledge of basic self care strategies and exercises to promote healing   Baseline: Goal status: INITIAL  2.  Patient will be able to walk medium distances with pain level 5/10 or less Baseline:  Goal status: INITIAL  3.  The patient will have improved hip strength to at least 4/5 needed for standing, walking longer distances and descending stairs at home and in the community  Baseline:  Goal status: INITIAL     LONG TERM GOALS: Target date: 01/04/2023    The patient will be independent in a safe self progression of a home exercise program and self care strategies to  promote further recovery of function  Baseline:  Goal status: INITIAL  2.  Patient will be able to walk 20 min with pain 4/10 Baseline:  Goal status: INITIAL  3.  The patient will have improved hip strength to at least 4+/5 needed for standing, walking longer distances and descending stairs at home and in the community  Baseline:  Goal status: INITIAL  4.  The patient will have improved trunk flexor and extensor muscle strength to at least 4+/5 needed for lifting her future newborn and baby carrier Baseline:  Goal status: INITIAL  5.  Modified Oswestry improved to 46% Baseline:  Goal status: INITIAL   PLAN:  PT FREQUENCY: 1x/week  PT DURATION: 12 weeks  PLANNED INTERVENTIONS: Therapeutic exercises, Therapeutic activity, Neuromuscular re-education, Patient/Family education, Self Care, Joint mobilization, Aquatic Therapy, Dry Needling, Electrical stimulation, Cryotherapy, Moist heat, Taping, Manual therapy, and Re-evaluation.  PLAN FOR NEXT SESSION: Aquatic PT; continue to progress pelvic stability exercises; reinforce concepts of breathing with movement for better pressure management  Moriyah [place] April Dell [place], PT 11/15/22 2:52 PM

## 2022-11-15 NOTE — Telephone Encounter (Signed)
Called pt in regards to missed appointment today. Left voicemail reminding pt of attendance policy and that it is her 2nd no-show. Informed her of her next scheduled appointment.   Teresa Burns Teresa Burns Teresa Burns, PT

## 2022-11-22 ENCOUNTER — Ambulatory Visit: Payer: Medicaid Other | Admitting: Rehabilitative and Restorative Service Providers"

## 2022-11-25 ENCOUNTER — Other Ambulatory Visit: Payer: Self-pay | Admitting: *Deleted

## 2022-11-25 ENCOUNTER — Ambulatory Visit (INDEPENDENT_AMBULATORY_CARE_PROVIDER_SITE_OTHER): Payer: Medicaid Other | Admitting: Obstetrics and Gynecology

## 2022-11-25 VITALS — BP 124/76 | HR 114 | Wt 171.2 lb

## 2022-11-25 DIAGNOSIS — Z3480 Encounter for supervision of other normal pregnancy, unspecified trimester: Secondary | ICD-10-CM

## 2022-11-25 DIAGNOSIS — D573 Sickle-cell trait: Secondary | ICD-10-CM

## 2022-11-25 DIAGNOSIS — Z3A32 32 weeks gestation of pregnancy: Secondary | ICD-10-CM

## 2022-11-25 DIAGNOSIS — O09523 Supervision of elderly multigravida, third trimester: Secondary | ICD-10-CM

## 2022-11-25 DIAGNOSIS — O99013 Anemia complicating pregnancy, third trimester: Secondary | ICD-10-CM

## 2022-11-25 NOTE — Progress Notes (Signed)
Pt presents for ROB. Has wrist pain, concerns for carpal tunnel.

## 2022-11-25 NOTE — Progress Notes (Signed)
Breast Pump order faxed to [facility].

## 2022-11-25 NOTE — Progress Notes (Signed)
   PRENATAL VISIT NOTE  Subjective:  Teresa Burns is a 37 y.o. G2P1001 at [redacted]w[redacted]d being seen today for ongoing prenatal care.  She is currently monitored for the following issues for this low-risk pregnancy and has Herpes genitalis; Supervision of other normal pregnancy, antepartum; Advanced maternal age in multigravida; and Sickle cell trait in mother affecting pregnancy (HCC) on their problem list.  Patient doing well with no acute concerns today. She reports carpal tunnel symptoms.  Contractions: Irritability. Vag. Bleeding: None.  Movement: Present. Denies leaking of fluid.   The following portions of the patient's history were reviewed and updated as appropriate: allergies, current medications, past family history, past medical history, past social history, past surgical history and problem list. Problem list updated.  Objective:   Vitals:   11/25/22 1042  BP: 124/76  Pulse: (!) 114  Weight: 171 lb 3.2 oz (77.7 kg)    Fetal Status: Fetal Heart Rate (bpm): 148 Fundal Height: 32 cm Movement: Present     General:  Alert, oriented and cooperative. Patient is in no acute distress.  Skin: Skin is warm and dry. No rash noted.   Cardiovascular: Normal heart rate noted  Respiratory: Normal respiratory effort, no problems with respiration noted  Abdomen: Soft, gravid, appropriate for gestational age.  Pain/Pressure: Absent     Pelvic: Cervical exam deferred        Extremities: Normal range of motion.  Edema: Trace  Mental Status:  Normal mood and affect. Normal behavior. Normal judgment and thought content.  Positive tinnel's sign on left hand c/w carpal tunnel Assessment and Plan:  Pregnancy: G2P1001 at 105w6d  1. Supervision of other normal pregnancy, antepartum Continue routine prenatal care Pt has hx of third degree tear.  She is considering a primary c section due to this tear and residual trauma.  Pt advised this will be shared decision making and that there is risk for increased  blood loss and longer recovery.  Pt no longer wants BTL, but may consider IUD  2. Sickle cell trait in mother affecting pregnancy (HCC)   3. Multigravida of advanced maternal age in third trimester   4. [redacted] weeks gestation of pregnancy   Preterm labor symptoms and general obstetric precautions including but not limited to vaginal bleeding, contractions, leaking of fluid and fetal movement were reviewed in detail with the patient.  Please refer to After Visit Summary for other counseling recommendations.   Return in about 2 weeks (around 12/09/2022) for ROB, in person.   Mariel Aloe, MD Faculty Attending Center for [facility]

## 2022-11-29 ENCOUNTER — Encounter: Payer: Self-pay | Admitting: Rehabilitative and Restorative Service Providers"

## 2022-11-29 ENCOUNTER — Ambulatory Visit: Payer: Medicaid Other | Admitting: Rehabilitative and Restorative Service Providers"

## 2022-11-29 ENCOUNTER — Ambulatory Visit
Payer: Medicaid Other | Attending: Obstetrics and Gynecology | Admitting: Rehabilitative and Restorative Service Providers"

## 2022-11-29 DIAGNOSIS — M5432 Sciatica, left side: Secondary | ICD-10-CM | POA: Insufficient documentation

## 2022-11-29 DIAGNOSIS — M6281 Muscle weakness (generalized): Secondary | ICD-10-CM | POA: Insufficient documentation

## 2022-11-29 DIAGNOSIS — M62838 Other muscle spasm: Secondary | ICD-10-CM | POA: Diagnosis present

## 2022-11-29 DIAGNOSIS — R279 Unspecified lack of coordination: Secondary | ICD-10-CM | POA: Insufficient documentation

## 2022-11-29 NOTE — Therapy (Signed)
OUTPATIENT PHYSICAL THERAPY THORACOLUMBAR    Patient Name: Teresa Burns MRN: [id number] DOB:01/30/1986, 37 y.o., female Today's Date: 11/29/2022  END OF SESSION:  PT End of Session - 11/29/22 1104     Visit Number 5    Date for PT Re-Evaluation 01/04/23    Authorization Type Medicaid    Authorization Time Period 10/12/2022-12/11/2022    Authorization - Visit Number 4    Authorization - Number of Visits 10    PT Start Time 1100    PT Stop Time 1140    PT Time Calculation (min) 40 min    Activity Tolerance Patient tolerated treatment well;Patient limited by pain    Behavior During Therapy [facility] for tasks assessed/performed               Past Medical History:  Diagnosis Date   Herpes    Pleuritic chest pain 07/15/2015   Sickle cell trait (HCC)    Past Surgical History:  Procedure Laterality Date   NO PAST SURGERIES     Patient Active Problem List   Diagnosis Date Noted   Sickle cell trait in mother affecting pregnancy (HCC) 10/20/2022   Advanced maternal age in multigravida 10/08/2022   Supervision of other normal pregnancy, antepartum 06/08/2022   Herpes genitalis 07/15/2015     REFERRING PROVIDER: Mariel Aloe MD  REFERRING DIAG: O26.899, M79.606 pregnancy related leg pain, sciatica  Rationale for Evaluation and Treatment: Rehabilitation  THERAPY DIAG:  sciatica ONSET DATE: February 2024  SUBJECTIVE:                                                                                                                                                                                           SUBJECTIVE STATEMENT:  Pt reports that she has been doing her pool exercises.  States that she continues to have increased pain and she is hoping that they will induce/c-section in early August.    PERTINENT HISTORY:  Pregnant [redacted] weeks due in August; normal pregnancy no precautions Son is 23 years old Has access to a pool PAIN:  PAIN:  Are you having pain? Yes NPRS  scale: 6/10   Pain location: some left hip pain, but mostly pubic symphysis Aggravating factors: bending over, getting in/out of car;  squatting, walking too fast Relieving factors: deep massage to piriformis, stretching but hard to get in positions now due to pregnancy   PRECAUTIONS: Other: pregnancy  WEIGHT BEARING RESTRICTIONS: No  FALLS:  Has patient fallen in last 6 months? No  LIVING ENVIRONMENT: Lives with: lives with their family Lives in: House/apartment  OCCUPATION: stay at home  PLOF: Independent  PATIENT  GOALS: pain relief for remainder of pregnancy    OBJECTIVE:   PATIENT SURVEYS:  Eval:  Modified Oswestry 56% self perceived disability   COGNITION: Overall cognitive status: Within functional limits for tasks assessed    PALPATION: Multiple tender points left gluteals  LUMBAR ROM:   AROM eval  Flexion WFLS  Extension 15  Right lateral flexion 30  Left lateral flexion 30  Right rotation   Left rotation    (Blank rows = not tested)  TRUNK STRENGTH:  Decreased activation of transverse abdominus muscles; abdominals 4-/5; decreased activation of lumbar multifidi; trunk extensors 4-/5  LOWER EXTREMITY ROM:   WFLS  LOWER EXTREMITY MMT:   Left hip abduction 3+/5; left knee extension 4-/5, left knee flexion 4-/5  FUNCTIONAL TESTS:  Unable to rise from sit to stand without moderate UE assist Pelvic drop on left and lateral trunk lean with SLS  Negative slump test GAIT:  Comments: antalgic, decreased gait speed  TODAY'S TREATMENT:                                                                                                                               DATE: 11/29/2022 Nustep level 4 x6 min with PT present to discuss status Seated hamstring stretch 2x20 sec bilat (with cuing for technique/posture and breathing) Seated piriformis stretch 2x20 sec bilat Seated sciatic nerve tensioner 2x10 bilat Standing calf stretch using bottom step 2x20 sec  bilat Right Sidelying clamshells 2x10, Left sidelying x10 Seated hip adduction ball squeeze 2x10 Seated on blue pball pelvic circles CW and CCW x10 each way Seated on blue pball performing across body reaching 2x10 each direction Standing with forward lean over green pball on mat performing lateral side sway x2 min   11/02/22:Pt arrives for aquatic physical therapy. Treatment took place in 3.5-5.5 feet of water. Water temperature was 91 degrees F. Pt entered the pool via stairs independently with use of handrails.  Seated water bench with 75% submersion Pt performed seated LE AROM exercises 20x in all planes, with concurrent discussion of water principles and how we will use them. Pt with verbal understanding. Also discussed and physically practiced proper breathing with our exercises and how they correlate with first term labor breathing.Semi-sit at bench for gentle arm extension with exhale and gentle core contraction 2x5. This was followed by bil seated figure 4 piriformis stretch1x Rt 2x LT holding for 3 breaths.  Horizontal decompression with neck support 2 min followed by vertical decompression float with yellow noodle. Verbally educated pt is how to replicate at her pool using both noodle and aqua jogger. Pt verbally understood. 75% depth for Post pelvic tilt against wall 2x5. 4 lengths of forward walking holding large noodle for postural support. 3 min decompression float to end.  10/26/22  (PFPT session) Neuromuscular re-education: Transversus abdominus training with multimodal cues for improved motor control and breath coordination Seated pelvic tilts with core activation and breath coordination  Seated UE ball  press with core activation 10x Seated unilateral UE ball press with core activation 10x bil Seated hip adduction ball squeeze 10x Seated hip abduction black band 10x Therapeutic activities: Bed mobility training with core activation Pelvic stability belt (motif) trial and  instruction Urge drill     PATIENT EDUCATION:  Education details: Educated patient on anatomy and physiology of current symptoms, prognosis, plan of care as well as initial self care strategies to promote recovery; discussed using a Belly Band support Person educated: Patient Education method: Explanation Education comprehension: verbalized understanding  HOME EXERCISE PROGRAM: Access Code: [email or web address] URL: [email or web address] Date: 10/12/2022 Prepared by: Teresa Burns  Exercises - Seated Table Piriformis Stretch  - 1 x daily - 7 x weekly - 1 sets - 3 reps - 30 hold - Child's Pose with Sidebending  - 1 x daily - 7 x weekly - 1 sets - 3 reps - 30 hold - Quadruped Cat Cow  - 1 x daily - 7 x weekly - 1 sets - 10 reps - Seated Piriformis Stretch with Trunk Bend  - 1 x daily - 7 x weekly - 1 sets - 3 reps - 30 hold  ASSESSMENT:  CLINICAL IMPRESSION:  Teresa Burns presents to skilled PT reporting that she continues to have back pain and is hoping that they will induce in early August.  Patient reports that the pool exercises do help.  Patient requires cuing throughout for improved technique and body mechanics.  Patient reports a decrease of pain during PT and reports pain of 3/10 at end of session.  Patient to return next week for reassessment.  GOALS: Goals reviewed with patient? Yes  SHORT TERM GOALS: Target date: 11/23/2022    The patient will demonstrate knowledge of basic self care strategies and exercises to promote healing   Baseline: Goal status: MET  2.  Patient will be able to walk medium distances with pain level 5/10 or less Baseline:  Goal status: IN PROGRESS  3.  The patient will have improved hip strength to at least 4/5 needed for standing, walking longer distances and descending stairs at home and in the community  Baseline:  Goal status: IN PROGRESS     LONG TERM GOALS: Target date: 01/04/2023    The patient will be independent in a safe self  progression of a home exercise program and self care strategies to promote further recovery of function  Baseline:  Goal status: INITIAL  2.  Patient will be able to walk 20 min with pain 4/10 Baseline:  Goal status: INITIAL  3.  The patient will have improved hip strength to at least 4+/5 needed for standing, walking longer distances and descending stairs at home and in the community  Baseline:  Goal status: INITIAL  4.  The patient will have improved trunk flexor and extensor muscle strength to at least 4+/5 needed for lifting her future newborn and baby carrier Baseline:  Goal status: INITIAL  5.  Modified Oswestry improved to 46% Baseline:  Goal status: INITIAL   PLAN:  PT FREQUENCY: 1x/week  PT DURATION: 12 weeks  PLANNED INTERVENTIONS: Therapeutic exercises, Therapeutic activity, Neuromuscular re-education, Patient/Family education, Self Care, Joint mobilization, Aquatic Therapy, Dry Needling, Electrical stimulation, Cryotherapy, Moist heat, Taping, Manual therapy, and Re-evaluation.  PLAN FOR NEXT SESSION: Aquatic PT; continue to progress pelvic stability exercises; reinforce concepts of breathing with movement for better pressure management, reassessment/modified oswestry.   Meghan Crowfoot, SPT Reather Laurence, PT, DPT 11/29/22, 12:07 PM  [facility] [phone]  [address], [address] [place], [place] [address] Phone # [phone] Fax [phone]

## 2022-12-06 ENCOUNTER — Telehealth: Payer: Self-pay | Admitting: Rehabilitative and Restorative Service Providers"

## 2022-12-06 ENCOUNTER — Ambulatory Visit: Payer: Medicaid Other | Admitting: Rehabilitative and Restorative Service Providers"

## 2022-12-06 NOTE — Telephone Encounter (Signed)
Called pt secondary to missed visit.  Patient apologized stating that her phone battery had died and her alarm did not go off.  Was able to reschedule for later in the week.  Pt confirmed appointment time.

## 2022-12-07 ENCOUNTER — Inpatient Hospital Stay (HOSPITAL_COMMUNITY)
Admission: AD | Admit: 2022-12-07 | Discharge: 2022-12-07 | Disposition: A | Discharge status: Home / Self Care | Payer: Medicaid Other | Attending: Obstetrics & Gynecology | Admitting: Obstetrics & Gynecology

## 2022-12-07 ENCOUNTER — Encounter (HOSPITAL_COMMUNITY): Payer: Self-pay | Admitting: Obstetrics & Gynecology

## 2022-12-07 ENCOUNTER — Other Ambulatory Visit: Payer: Self-pay

## 2022-12-07 DIAGNOSIS — O26893 Other specified pregnancy related conditions, third trimester: Secondary | ICD-10-CM | POA: Diagnosis not present

## 2022-12-07 DIAGNOSIS — R109 Unspecified abdominal pain: Secondary | ICD-10-CM | POA: Diagnosis present

## 2022-12-07 DIAGNOSIS — M549 Dorsalgia, unspecified: Secondary | ICD-10-CM | POA: Diagnosis not present

## 2022-12-07 DIAGNOSIS — Z3A34 34 weeks gestation of pregnancy: Secondary | ICD-10-CM | POA: Insufficient documentation

## 2022-12-07 LAB — URINALYSIS, ROUTINE W REFLEX MICROSCOPIC
Bilirubin Urine: NEGATIVE
Glucose, UA: NEGATIVE mg/dL
Hgb urine dipstick: NEGATIVE
Ketones, ur: NEGATIVE mg/dL
Leukocytes,Ua: NEGATIVE
Nitrite: NEGATIVE
Protein, ur: NEGATIVE mg/dL
Specific Gravity, Urine: 1.008 (ref 1.005–1.030)
pH: 6 (ref 5.0–8.0)

## 2022-12-07 LAB — WET PREP, GENITAL
Clue Cells Wet Prep HPF POC: NONE SEEN
Sperm: NONE SEEN
Trich, Wet Prep: NONE SEEN
WBC, Wet Prep HPF POC: <10 (ref <10)
Yeast Wet Prep HPF POC: NONE SEEN

## 2022-12-07 MED ORDER — CYCLOBENZAPRINE HCL 5 MG PO TABS: 5.0000 mg | ORAL_TABLET | Freq: Once | ORAL | Status: DC

## 2022-12-07 MED ORDER — CYCLOBENZAPRINE HCL 5 MG PO TABS
10.0000 mg | ORAL_TABLET | Freq: Once | ORAL | Status: AC
  Administered 2022-12-07: 10 mg via ORAL
  Filled 2022-12-07: qty 2

## 2022-12-07 MED ORDER — CYCLOBENZAPRINE HCL 10 MG PO TABS: 10.0000 mg | ORAL_TABLET | Freq: Two times a day (BID) | ORAL | 0 refills | Status: DC | PRN

## 2022-12-07 NOTE — MAU Provider Note (Signed)
History     CSN: [id number]  Arrival date and time: 12/07/22 1245   Event Date/Time   First Provider Initiated Contact with Patient 12/07/22 1330      Chief Complaint  Patient presents with   Abdominal Pain   HPI Ms. Teresa Burns is a 37 y.o. G2P1001 at [redacted]w[redacted]d who presents to MAU today with complaint of lower abdominal and low back pain since last night. Pain is worse with fetal movement. Fetal movement is frequent. Patient denies history of PTL or PTD with previous pregnancy. Patient denies vaginal bleeding, odor, itching, UTI symptoms or change in recent activity. Patient has not taken anything for pain. She has noted a yellow discharge.    OB History     Gravida  2   Para  1   Term  1   Preterm  0   AB  0   Living  1      SAB  0   IAB  0   Ectopic  0   Multiple  0   Live Births  1           Past Medical History:  Diagnosis Date   Herpes    Pleuritic chest pain 07/15/2015   Sickle cell trait (HCC)     Past Surgical History:  Procedure Laterality Date   NO PAST SURGERIES      Family History  Problem Relation Age of Onset   Hypertension Mother    Bipolar disorder Mother    Stroke Maternal Grandmother    Diabetes Maternal Grandmother    Hypertension Maternal Grandmother    Cancer Maternal Grandmother     Social History   Tobacco Use   Smoking status: Former    Packs/day: .5    Types: Cigarettes    Quit date: 05/16/2022    Years since quitting: 0.5   Smokeless tobacco: Never   Tobacco comments:    1/2PPD  Vaping Use   Vaping Use: Never used  Substance Use Topics   Alcohol use: Not Currently    Comment: pt states 8 to 10 shots nightly   Drug use: Not Currently    Types: Marijuana    Comment: last use 05/16/22    Allergies: No Known Allergies  Medications Prior to Admission  Medication Sig Dispense Refill Last Dose   Prenatal Vit-Fe Phos-FA-Omega (VITAFOL GUMMIES) 3.33-0.333-34.8 MG CHEW Chew 3 tablets by mouth daily. 90  tablet 11 12/06/2022   Blood Pressure Monitoring (BLOOD PRESSURE KIT) DEVI 1 Device by Does not apply route once a week. (Patient not taking: Reported on 07/15/2022) 1 each 0     Review of Systems  Constitutional:  Negative for fever.  Gastrointestinal:  Positive for abdominal pain.  Genitourinary:  Positive for pelvic pain and vaginal discharge. Negative for dysuria, frequency, urgency and vaginal bleeding.  Musculoskeletal:  Positive for back pain.   Physical Exam   Blood pressure 122/80, pulse (!) 104, temperature 98 F (36.7 C), temperature source Oral, resp. rate 18, height 5\' 1"  (1.549 m), weight 80.6 kg, last menstrual period 04/09/2022, SpO2 99 %.  Physical Exam Vitals and nursing note reviewed. Exam conducted with a chaperone present.  Constitutional:      General: She is not in acute distress.    Appearance: Normal appearance. She is well-developed.  HENT:     Head: Normocephalic and atraumatic.  Neck:     Thyroid: No thyromegaly.  Cardiovascular:     Rate and Rhythm: Normal rate and regular  rhythm.     Heart sounds: No murmur heard. Pulmonary:     Effort: Pulmonary effort is normal. No respiratory distress.     Breath sounds: Normal breath sounds. No wheezing.  Abdominal:     General: Abdomen is flat. Bowel sounds are normal. There is no distension.     Palpations: Abdomen is soft. There is no mass.     Tenderness: There is no abdominal tenderness. There is no guarding or rebound.  Musculoskeletal:     Cervical back: Neck supple.  Skin:    General: Skin is warm and dry.     Findings: No erythema.  Neurological:     Mental Status: She is alert and oriented to person, place, and time.  Psychiatric:        Mood and Affect: Mood normal.   Cervical Exam:  Dilation: Closed Effacement (%): Thick Exam by:: Vonzella Nipple, PA-C  Fetal Monitoring: Baseline: 150 bpm Variability: moderate Accelerations: 15 x 15 Decelerations: none  Contractions: mild UI  MAU Course   Procedures None   MDM UA, Wet prep, GC/CT today  EFM without signs of contractions on TOCO. Cervix closed, thick. No evidence of PTL.  Flexeril 10 mg PO given, patient reports improvement in symptoms.   Assessment and Plan  A:  SIUP at [redacted]w[redacted]d Abdominal pain in pregnancy, third trimester  Back pain in pregnancy, third trimester   P:  Discharge home Rx for Flexeril sent to patient's pharmacy  PTL precautions discussed Patient advised to follow-up with [facility] as scheduled on Monday  Patient may return to MAU as needed or if her condition were to change or worsen  Vonzella Nipple 12/07/2022, 2:34 PM

## 2022-12-07 NOTE — MAU Note (Signed)
Teresa Burns is a 37 y.o. at [redacted]w[redacted]d here in MAU reporting: she been having both lower abdominal and back pain that began last night.  Denies VB or LOF.  Endorses +FM LMP: 04/09/22 Onset of complaint: last night Pain score: 7 Vitals:   12/07/22 1321  BP: 123/80  Pulse: (!) 120  Resp: 20  Temp: 99.1 F (37.3 C)  SpO2: 98%     FHT:150 bpm Lab orders placed from triage:   UA

## 2022-12-08 ENCOUNTER — Ambulatory Visit: Payer: Medicaid Other | Admitting: Rehabilitative and Restorative Service Providers"

## 2022-12-08 ENCOUNTER — Encounter: Payer: Self-pay | Admitting: Rehabilitative and Restorative Service Providers"

## 2022-12-08 DIAGNOSIS — R279 Unspecified lack of coordination: Secondary | ICD-10-CM

## 2022-12-08 DIAGNOSIS — M62838 Other muscle spasm: Secondary | ICD-10-CM

## 2022-12-08 DIAGNOSIS — M6281 Muscle weakness (generalized): Secondary | ICD-10-CM

## 2022-12-08 DIAGNOSIS — M5432 Sciatica, left side: Secondary | ICD-10-CM | POA: Diagnosis not present

## 2022-12-08 LAB — GC/CHLAMYDIA PROBE AMP (~~LOC~~) NOT AT ARMC
Chlamydia: NEGATIVE
Comment: NEGATIVE
Comment: NORMAL
Neisseria Gonorrhea: NEGATIVE

## 2022-12-08 NOTE — Therapy (Signed)
OUTPATIENT PHYSICAL THERAPY TREATMENT NOTE   Patient Name: Teresa Burns MRN: [id number] DOB:12-08-1985, 37 y.o., female Today's Date: 12/08/2022  END OF SESSION:  PT End of Session - 12/08/22 1527     Visit Number 6    Date for PT Re-Evaluation 01/04/23    Authorization Type Medicaid    Authorization Time Period 10/12/2022 - 01/10/2023    Authorization - Visit Number 5    Authorization - Number of Visits 10    PT Start Time 1524    PT Stop Time 1605    PT Time Calculation (min) 41 min    Activity Tolerance Patient tolerated treatment well    Behavior During Therapy [facility] for tasks assessed/performed               Past Medical History:  Diagnosis Date   Herpes    Pleuritic chest pain 07/15/2015   Sickle cell trait (HCC)    Past Surgical History:  Procedure Laterality Date   NO PAST SURGERIES     Patient Active Problem List   Diagnosis Date Noted   Sickle cell trait in mother affecting pregnancy (HCC) 10/20/2022   Advanced maternal age in multigravida 10/08/2022   Supervision of other normal pregnancy, antepartum 06/08/2022   Herpes genitalis 07/15/2015     REFERRING PROVIDER: Mariel Aloe MD  REFERRING DIAG: O26.899, M79.606 pregnancy related leg pain, sciatica  Rationale for Evaluation and Treatment: Rehabilitation  THERAPY DIAG:  sciatica ONSET DATE: February 2024  SUBJECTIVE:                                                                                                                                                                                           SUBJECTIVE STATEMENT:  Pt reports that she was in the ED yesterday secondary to abdominal pain and inability to sleep.  Pt states that they gave her a muscle relaxer, which helped.     PERTINENT HISTORY:  Pregnant [redacted] weeks due in August; normal pregnancy no precautions Son is 66 years old Has access to a pool PAIN:  PAIN:  Are you having pain? Yes NPRS scale: 5-6/10   Pain location: some  left hip pain, but mostly pubic symphysis Aggravating factors: bending over, getting in/out of car;  squatting, walking too fast Relieving factors: deep massage to piriformis, stretching but hard to get in positions now due to pregnancy   PRECAUTIONS: Other: pregnancy  WEIGHT BEARING RESTRICTIONS: No  FALLS:  Has patient fallen in last 6 months? No  LIVING ENVIRONMENT: Lives with: lives with their family Lives in: House/apartment  OCCUPATION: stay at home  PLOF: Independent  PATIENT GOALS:  pain relief for remainder of pregnancy    OBJECTIVE:   PATIENT SURVEYS:  Eval:  Modified Oswestry 56% self perceived disability  12/08/2022:  Modified Oswestry Low Back Pain Disability Questionnaire: 11 / 50 = 22.0 %  COGNITION: Overall cognitive status: Within functional limits for tasks assessed    PALPATION: Multiple tender points left gluteals  LUMBAR ROM:   AROM eval  Flexion WFLS  Extension 15  Right lateral flexion 30  Left lateral flexion 30  Right rotation   Left rotation    (Blank rows = not tested)  TRUNK STRENGTH:  Decreased activation of transverse abdominus muscles; abdominals 4-/5; decreased activation of lumbar multifidi; trunk extensors 4-/5  LOWER EXTREMITY ROM:   WFLS  LOWER EXTREMITY MMT:   Left hip abduction 3+/5; left knee extension 4-/5, left knee flexion 4-/5  FUNCTIONAL TESTS:  Unable to rise from sit to stand without moderate UE assist Pelvic drop on left and lateral trunk lean with SLS  Negative slump test GAIT:  Comments: antalgic, decreased gait speed  TODAY'S TREATMENT:                                                                                                                               DATE: 12/08/2022 Nustep level 4 x6 min with PT present to discuss status Seated hamstring stretch 2x20 sec bilat (with cuing for technique/posture and breathing) Seated piriformis stretch 2x20 sec bilat Seated sciatic nerve tensioner 2x10  bilat Seated on blue pball pelvic circles CW and CCW / forward and backward x10 each way Seated hip adduction ball squeeze 2 x10 Standing with forward lean over green pball on mat performing lateral side sway x2 min Quadruped Cat and cow 2 x 10  Quadruped tail wags 2 x 10 Supine butterfly stretch 3 x 20 sec  Supine clamshells with blue resistance loop 3 x 10  DATE: 11/29/2022 Nustep level 4 x6 min with PT present to discuss status Seated hamstring stretch 2x20 sec bilat (with cuing for technique/posture and breathing) Seated piriformis stretch 2x20 sec bilat Seated sciatic nerve tensioner 2x10 bilat Standing calf stretch using bottom step 2x20 sec bilat Right Sidelying clamshells 2x10, Left sidelying x10 Seated hip adduction ball squeeze 2x10 Seated on blue pball pelvic circles CW and CCW x10 each way Seated on blue pball performing across body reaching 2x10 each direction Standing with forward lean over green pball on mat performing lateral side sway x2 min   11/02/22:Pt arrives for aquatic physical therapy. Treatment took place in 3.5-5.5 feet of water. Water temperature was 91 degrees F. Pt entered the pool via stairs independently with use of handrails.  Seated water bench with 75% submersion Pt performed seated LE AROM exercises 20x in all planes, with concurrent discussion of water principles and how we will use them. Pt with verbal understanding. Also discussed and physically practiced proper breathing with our exercises and how they correlate with first term labor breathing.Semi-sit at bench for  gentle arm extension with exhale and gentle core contraction 2x5. This was followed by bil seated figure 4 piriformis stretch1x Rt 2x LT holding for 3 breaths.  Horizontal decompression with neck support 2 min followed by vertical decompression float with yellow noodle. Verbally educated pt is how to replicate at her pool using both noodle and aqua jogger. Pt verbally understood. 75% depth for  Post pelvic tilt against wall 2x5. 4 lengths of forward walking holding large noodle for postural support. 3 min decompression float to end.      PATIENT EDUCATION:  Education details: Educated patient on anatomy and physiology of current symptoms, prognosis, plan of care as well as initial self care strategies to promote recovery; discussed using a Belly Band support Person educated: Patient Education method: Explanation Education comprehension: verbalized understanding  HOME EXERCISE PROGRAM: Access Code: [email or web address] URL: [email or web address] Date: 12/08/2022 Prepared by: Clydie Braun Tiffany Talarico  Exercises - Seated Table Piriformis Stretch  - 1 x daily - 7 x weekly - 1 sets - 3 reps - 30 hold - Child's Pose with Sidebending  - 1 x daily - 7 x weekly - 1 sets - 3 reps - 30 hold - Quadruped Cat Cow  - 1 x daily - 7 x weekly - 1 sets - 10 reps - Seated Piriformis Stretch with Trunk Bend  - 1 x daily - 7 x weekly - 1 sets - 3 reps - 30 hold - Clamshell  - 1 x daily - 7 x weekly - 2 sets - 10 reps - Sidelying Hip Adduction Isometric with Ball  - 1 x daily - 7 x weekly - 2 sets - 10 reps - Seated core activation with ball press  - 1 x daily - 7 x weekly - 1 sets - 10 reps - Seated Hip Adduction Squeeze with Ball  - 1 x daily - 7 x weekly - 1 sets - 10 reps - Seated Isometric Hip Abduction with Resistance  - 1 x daily - 7 x weekly - 1 sets - 10 reps - Pelvic Tilt on Swiss Ball  - 1 x daily - 7 x weekly - 2 sets - 10 reps - Seated Lateral Pelvic Tilt on Swiss Ball  - 1 x daily - 7 x weekly - 2 sets - 10 reps - Pelvic Circles on Swiss Ball  - 1 x daily - 7 x weekly - 2 sets - 10 reps  ASSESSMENT:  CLINICAL IMPRESSION:  Milly presents to skilled PT reporting that she continues to have back pain and is hoping that they will induce in early August.  Patient required less cuing with both verbal and tactile cuing demonstrated increased neuromuscular control. Likewise, pelvic mobility and core  recruitment continues to improve with each session. Patient continues to report pain relief and improvement in range of motion with every session stating she "feels lighter each time".   GOALS: Goals reviewed with patient? Yes  SHORT TERM GOALS: Target date: 11/23/2022    The patient will demonstrate knowledge of basic self care strategies and exercises to promote healing   Baseline: Goal status: MET  2.  Patient will be able to walk medium distances with pain level 5/10 or less Baseline:  Goal status: NOT MET  3.  The patient will have improved hip strength to at least 4/5 needed for standing, walking longer distances and descending stairs at home and in the community  Baseline:  Goal status: MET     LONG TERM GOALS: Target date: 01/04/2023  The patient will be independent in a safe self progression of a home exercise program and self care strategies to promote further recovery of function  Baseline:  Goal status: MET  2.  Patient will be able to walk 20 min with pain 4/10 Baseline:  Goal status: IN PROGRESS  3.  The patient will have improved hip strength to at least 4+/5 needed for standing, walking longer distances and descending stairs at home and in the community  Baseline:  Goal status: IN PROGRESS  4.  The patient will have improved trunk flexor and extensor muscle strength to at least 4+/5 needed for lifting her future newborn and baby carrier Baseline:  Goal status: IN PROGRESS  5.  Modified Oswestry improved to 46% Baseline:  Goal status: MET on 12/08/2022   PLAN:  PT FREQUENCY: 1x/week  PT DURATION: 12 weeks  PLANNED INTERVENTIONS: Therapeutic exercises, Therapeutic activity, Neuromuscular re-education, Patient/Family education, Self Care, Joint mobilization, Aquatic Therapy, Dry Needling, Electrical stimulation, Cryotherapy, Moist heat, Taping, Manual therapy, and Re-evaluation.  PLAN FOR NEXT SESSION: Aquatic PT; continue to progress pelvic  stability exercises; reinforce concepts of breathing with movement for better pressure management, reassessment/modified oswestry.   Ailene Ards, SPT Reather Laurence, PT, DPT 12/08/22, 4:13 PM  [facility] [address] [place], [place] [address] Phone # [phone] Fax [phone]

## 2022-12-13 ENCOUNTER — Encounter: Payer: Self-pay | Admitting: Obstetrics & Gynecology

## 2022-12-13 ENCOUNTER — Ambulatory Visit: Payer: Medicaid Other | Admitting: Obstetrics & Gynecology

## 2022-12-13 VITALS — BP 117/78 | HR 111 | Wt 178.0 lb

## 2022-12-13 DIAGNOSIS — O09299 Supervision of pregnancy with other poor reproductive or obstetric history, unspecified trimester: Secondary | ICD-10-CM

## 2022-12-13 DIAGNOSIS — O09523 Supervision of elderly multigravida, third trimester: Secondary | ICD-10-CM

## 2022-12-13 DIAGNOSIS — Z3480 Encounter for supervision of other normal pregnancy, unspecified trimester: Secondary | ICD-10-CM

## 2022-12-13 HISTORY — DX: Supervision of pregnancy with other poor reproductive or obstetric history, unspecified trimester: O09.299

## 2022-12-13 NOTE — Progress Notes (Unsigned)
   PRENATAL VISIT NOTE  Subjective:  Teresa Burns is a 37 y.o. G2P1001 at [redacted]w[redacted]d being seen today for ongoing prenatal care.  She is currently monitored for the following issues for this high-risk pregnancy and has Herpes genitalis; Supervision of other normal pregnancy, antepartum; Advanced maternal age in multigravida; Sickle cell trait in mother affecting pregnancy (HCC); and History of macrosomia in infant in prior pregnancy, currently pregnant on their problem list.  Patient reports occasional contractions.  Contractions: Irritability. Vag. Bleeding: None.  Movement: Present. Denies leaking of fluid.   The following portions of the patient's history were reviewed and updated as appropriate: allergies, current medications, past family history, past medical history, past social history, past surgical history and problem list.   Objective:   Vitals:   12/13/22 0933  BP: 117/78  Pulse: (!) 111  Weight: 178 lb (80.7 kg)    Fetal Status: Fetal Heart Rate (bpm): 154   Movement: Present     General:  Alert, oriented and cooperative. Patient is in no acute distress.  Skin: Skin is warm and dry. No rash noted.   Cardiovascular: Normal heart rate noted  Respiratory: Normal respiratory effort, no problems with respiration noted  Abdomen: Soft, gravid, appropriate for gestational age.  Pain/Pressure: Absent     Pelvic: Cervical exam deferred        Extremities: Normal range of motion.  Edema: None  Mental Status: Normal mood and affect. Normal behavior. Normal judgment and thought content.   Assessment and Plan:  Pregnancy: G2P1001 at [redacted]w[redacted]d 1. Supervision of other normal pregnancy, antepartum States h/o fetal macrosomia, [place] ordered  2. Multigravida of advanced maternal age in third trimester 1 week f/u  Preterm labor symptoms and general obstetric precautions including but not limited to vaginal bleeding, contractions, leaking of fluid and fetal movement were reviewed in detail with  the patient. Please refer to After Visit Summary for other counseling recommendations.   Return in about 1 week (around 12/20/2022).  Future Appointments  Date Time Provider Department Center  12/14/2022 12:30 PM Reather Laurence, PT [facility] None  12/20/2022 10:35 AM Lennart Pall, MD [facility] None  12/23/2022 12:30 PM Menke, [place], PT [facility] None  12/27/2022  9:35 AM Lennart Pall, MD [facility] None  12/30/2022 12:30 PM Georg Ruddle, [place], PT [facility] None  01/03/2023  8:55 AM Constant, Gigi Gin, MD [facility] None  01/04/2023 12:30 PM Menke, [place], PT [facility] None  01/10/2023  9:55 AM Adam Phenix, MD [facility] None  01/17/2023 10:35 AM Constant, Gigi Gin, MD [facility] None  01/24/2023  9:55 AM Constant, Gigi Gin, MD [facility] None    Scheryl Darter, MD

## 2022-12-13 NOTE — Progress Notes (Signed)
Pt presents for ROB No longer desires BTL, undecided on birth control

## 2022-12-14 ENCOUNTER — Ambulatory Visit: Payer: Medicaid Other | Admitting: Rehabilitative and Restorative Service Providers"

## 2022-12-14 ENCOUNTER — Encounter: Payer: Self-pay | Admitting: Rehabilitative and Restorative Service Providers"

## 2022-12-14 DIAGNOSIS — M6281 Muscle weakness (generalized): Secondary | ICD-10-CM

## 2022-12-14 DIAGNOSIS — M5432 Sciatica, left side: Secondary | ICD-10-CM | POA: Diagnosis not present

## 2022-12-14 NOTE — Therapy (Signed)
OUTPATIENT PHYSICAL THERAPY TREATMENT NOTE   Patient Name: Teresa Burns MRN: [id number] DOB:22-Nov-1985, 37 y.o., female Today's Date: 12/14/2022  END OF SESSION:  PT End of Session - 12/14/22 1239     Visit Number 7    Date for PT Re-Evaluation 01/04/23    Authorization Type Medicaid    Authorization Time Period 10/12/2022 - 01/10/2023    Authorization - Visit Number 6    Authorization - Number of Visits 10    PT Start Time 1234    PT Stop Time 1312    PT Time Calculation (min) 38 min    Activity Tolerance Patient tolerated treatment well    Behavior During Therapy [facility] for tasks assessed/performed               Past Medical History:  Diagnosis Date   Herpes    Pleuritic chest pain 07/15/2015   Sickle cell trait (HCC)    Past Surgical History:  Procedure Laterality Date   NO PAST SURGERIES     Patient Active Problem List   Diagnosis Date Noted   History of macrosomia in infant in prior pregnancy, currently pregnant 12/13/2022   Sickle cell trait in mother affecting pregnancy (HCC) 10/20/2022   Advanced maternal age in multigravida 10/08/2022   Supervision of other normal pregnancy, antepartum 06/08/2022   Herpes genitalis 07/15/2015     REFERRING PROVIDER: Mariel Aloe MD  REFERRING DIAG: O26.899, M79.606 pregnancy related leg pain, sciatica  Rationale for Evaluation and Treatment: Rehabilitation  THERAPY DIAG:  sciatica ONSET DATE: February 2024  SUBJECTIVE:                                                                                                                                                                                           SUBJECTIVE STATEMENT:  Pt reports that she is having some less pain than last time.  States that she is dilated to 1.  Reports that she would like to get another session in the pool, if possible.    PERTINENT HISTORY:  Pregnant [redacted] weeks due in August; normal pregnancy no precautions Son is 70 years old Has  access to a pool PAIN:  PAIN:  Are you having pain? Yes NPRS scale: 4/10   Pain location: some left hip pain, but mostly pubic symphysis Aggravating factors: bending over, getting in/out of car;  squatting, walking too fast Relieving factors: deep massage to piriformis, stretching but hard to get in positions now due to pregnancy   PRECAUTIONS: Other: pregnancy  WEIGHT BEARING RESTRICTIONS: No  FALLS:  Has patient fallen in last 6 months? No  LIVING ENVIRONMENT: Lives with:  lives with their family Lives in: House/apartment  OCCUPATION: stay at home  PLOF: Independent  PATIENT GOALS: pain relief for remainder of pregnancy    OBJECTIVE:   PATIENT SURVEYS:  Eval:  Modified Oswestry 56% self perceived disability  12/08/2022:  Modified Oswestry Low Back Pain Disability Questionnaire: 11 / 50 = 22.0 %  COGNITION: Overall cognitive status: Within functional limits for tasks assessed    PALPATION: Multiple tender points left gluteals  LUMBAR ROM:   AROM eval  Flexion WFLS  Extension 15  Right lateral flexion 30  Left lateral flexion 30  Right rotation   Left rotation    (Blank rows = not tested)  TRUNK STRENGTH:  Decreased activation of transverse abdominus muscles; abdominals 4-/5; decreased activation of lumbar multifidi; trunk extensors 4-/5  LOWER EXTREMITY ROM:   WFLS  LOWER EXTREMITY MMT:   Left hip abduction 3+/5; left knee extension 4-/5, left knee flexion 4-/5  FUNCTIONAL TESTS:  Unable to rise from sit to stand without moderate UE assist Pelvic drop on left and lateral trunk lean with SLS  Negative slump test GAIT:  Comments: antalgic, decreased gait speed  TODAY'S TREATMENT:                                                                                                                               DATE: 12/14/2022 Nustep level 4 x7 min with PT present to discuss status Standing with forward lean over green pball on mat performing lateral side  sway x2 min Seated on blue pball pelvic circles CW and CCW / forward and backward x20 each way Seated side stretch over pink peanut x10 bilat Side-lying reverse clamshell with pink peanut ball x10 bilat Side-lying clamshell 2x10 bilat Quadruped TA contraction 2x10 Quadruped tail wags 2x10 Supine butterfly stretch 3 x 20 sec  Supine clamshell with green tband 2x10   DATE: 12/08/2022 Nustep level 4 x6 min with PT present to discuss status Seated hamstring stretch 2x20 sec bilat (with cuing for technique/posture and breathing) Seated piriformis stretch 2x20 sec bilat Seated sciatic nerve tensioner 2x10 bilat Seated on blue pball pelvic circles CW and CCW / forward and backward x10 each way Seated hip adduction ball squeeze 2 x10 Standing with forward lean over green pball on mat performing lateral side sway x2 min Quadruped Cat and cow 2 x 10  Quadruped tail wags 2 x 10 Supine butterfly stretch 3 x 20 sec  Supine clamshells with blue resistance loop 3 x 10  DATE: 11/29/2022 Nustep level 4 x6 min with PT present to discuss status Seated hamstring stretch 2x20 sec bilat (with cuing for technique/posture and breathing) Seated piriformis stretch 2x20 sec bilat Seated sciatic nerve tensioner 2x10 bilat Standing calf stretch using bottom step 2x20 sec bilat Right Sidelying clamshells 2x10, Left sidelying x10 Seated hip adduction ball squeeze 2x10 Seated on blue pball pelvic circles CW and CCW x10 each way Seated on blue pball performing across body  reaching 2x10 each direction Standing with forward lean over green pball on mat performing lateral side sway x2 min   11/02/22:Pt arrives for aquatic physical therapy. Treatment took place in 3.5-5.5 feet of water. Water temperature was 91 degrees F. Pt entered the pool via stairs independently with use of handrails.  Seated water bench with 75% submersion Pt performed seated LE AROM exercises 20x in all planes, with concurrent discussion of water  principles and how we will use them. Pt with verbal understanding. Also discussed and physically practiced proper breathing with our exercises and how they correlate with first term labor breathing.Semi-sit at bench for gentle arm extension with exhale and gentle core contraction 2x5. This was followed by bil seated figure 4 piriformis stretch1x Rt 2x LT holding for 3 breaths.  Horizontal decompression with neck support 2 min followed by vertical decompression float with yellow noodle. Verbally educated pt is how to replicate at her pool using both noodle and aqua jogger. Pt verbally understood. 75% depth for Post pelvic tilt against wall 2x5. 4 lengths of forward walking holding large noodle for postural support. 3 min decompression float to end.      PATIENT EDUCATION:  Education details: Educated patient on anatomy and physiology of current symptoms, prognosis, plan of care as well as initial self care strategies to promote recovery; discussed using a Belly Band support Person educated: Patient Education method: Explanation Education comprehension: verbalized understanding  HOME EXERCISE PROGRAM: Access Code: [email or web address] URL: [email or web address] Date: 12/08/2022 Prepared by: Clydie Braun Giordano Getman  Exercises - Seated Table Piriformis Stretch  - 1 x daily - 7 x weekly - 1 sets - 3 reps - 30 hold - Child's Pose with Sidebending  - 1 x daily - 7 x weekly - 1 sets - 3 reps - 30 hold - Quadruped Cat Cow  - 1 x daily - 7 x weekly - 1 sets - 10 reps - Seated Piriformis Stretch with Trunk Bend  - 1 x daily - 7 x weekly - 1 sets - 3 reps - 30 hold - Clamshell  - 1 x daily - 7 x weekly - 2 sets - 10 reps - Sidelying Hip Adduction Isometric with Ball  - 1 x daily - 7 x weekly - 2 sets - 10 reps - Seated core activation with ball press  - 1 x daily - 7 x weekly - 1 sets - 10 reps - Seated Hip Adduction Squeeze with Ball  - 1 x daily - 7 x weekly - 1 sets - 10 reps - Seated Isometric Hip  Abduction with Resistance  - 1 x daily - 7 x weekly - 1 sets - 10 reps - Pelvic Tilt on Swiss Ball  - 1 x daily - 7 x weekly - 2 sets - 10 reps - Seated Lateral Pelvic Tilt on Swiss Ball  - 1 x daily - 7 x weekly - 2 sets - 10 reps - Pelvic Circles on Swiss Ball  - 1 x daily - 7 x weekly - 2 sets - 10 reps  ASSESSMENT:  CLINICAL IMPRESSION:  Maydell presents to skilled PT reporting that her OB did feel that she would deliver before 40 weeks.  Patient states that she only had one aquatic PT session and would like another one.  This PT was able to switch schedule for patient next week to allow her to see aquatic PT next week.  Patient able to progress with strengthening and core stabilization.  At end of session, patient  reports that she feels better and has less pain.  GOALS: Goals reviewed with patient? Yes  SHORT TERM GOALS: Target date: 11/23/2022    The patient will demonstrate knowledge of basic self care strategies and exercises to promote healing   Baseline: Goal status: MET  2.  Patient will be able to walk medium distances with pain level 5/10 or less Baseline:  Goal status: MET  3.  The patient will have improved hip strength to at least 4/5 needed for standing, walking longer distances and descending stairs at home and in the community  Baseline:  Goal status: MET     LONG TERM GOALS: Target date: 01/04/2023    The patient will be independent in a safe self progression of a home exercise program and self care strategies to promote further recovery of function  Baseline:  Goal status: MET  2.  Patient will be able to walk 20 min with pain 4/10 Baseline:  Goal status: IN PROGRESS  3.  The patient will have improved hip strength to at least 4+/5 needed for standing, walking longer distances and descending stairs at home and in the community  Baseline:  Goal status: IN PROGRESS  4.  The patient will have improved trunk flexor and extensor muscle strength to at least  4+/5 needed for lifting her future newborn and baby carrier Baseline:  Goal status: IN PROGRESS  5.  Modified Oswestry improved to 46% Baseline:  Goal status: MET on 12/08/2022   PLAN:  PT FREQUENCY: 1x/week  PT DURATION: 12 weeks  PLANNED INTERVENTIONS: Therapeutic exercises, Therapeutic activity, Neuromuscular re-education, Patient/Family education, Self Care, Joint mobilization, Aquatic Therapy, Dry Needling, Electrical stimulation, Cryotherapy, Moist heat, Taping, Manual therapy, and Re-evaluation.  PLAN FOR NEXT SESSION: Aquatic PT; continue to progress pelvic stability exercises; reinforce concepts of breathing with movement for better pressure management.    Reather Laurence, PT, DPT 12/14/22, 1:25 PM  [facility] [address] [place], [place] [address] Phone # [phone] Fax [phone]

## 2022-12-20 ENCOUNTER — Encounter: Payer: Medicaid Other | Admitting: Obstetrics and Gynecology

## 2022-12-20 ENCOUNTER — Ambulatory Visit: Payer: Medicaid Other | Admitting: *Deleted

## 2022-12-20 ENCOUNTER — Ambulatory Visit: Payer: Medicaid Other | Attending: Obstetrics & Gynecology

## 2022-12-20 ENCOUNTER — Ambulatory Visit (INDEPENDENT_AMBULATORY_CARE_PROVIDER_SITE_OTHER): Payer: Medicaid Other | Admitting: Obstetrics and Gynecology

## 2022-12-20 VITALS — BP 137/86 | HR 105 | Wt 180.0 lb

## 2022-12-20 VITALS — BP 133/71 | HR 100

## 2022-12-20 DIAGNOSIS — A6000 Herpesviral infection of urogenital system, unspecified: Secondary | ICD-10-CM | POA: Insufficient documentation

## 2022-12-20 DIAGNOSIS — O09523 Supervision of elderly multigravida, third trimester: Secondary | ICD-10-CM | POA: Diagnosis not present

## 2022-12-20 DIAGNOSIS — Z3A36 36 weeks gestation of pregnancy: Secondary | ICD-10-CM | POA: Insufficient documentation

## 2022-12-20 DIAGNOSIS — O09299 Supervision of pregnancy with other poor reproductive or obstetric history, unspecified trimester: Secondary | ICD-10-CM

## 2022-12-20 DIAGNOSIS — Z8759 Personal history of other complications of pregnancy, childbirth and the puerperium: Secondary | ICD-10-CM

## 2022-12-20 DIAGNOSIS — O99019 Anemia complicating pregnancy, unspecified trimester: Secondary | ICD-10-CM

## 2022-12-20 DIAGNOSIS — D573 Sickle-cell trait: Secondary | ICD-10-CM

## 2022-12-20 DIAGNOSIS — Z3480 Encounter for supervision of other normal pregnancy, unspecified trimester: Secondary | ICD-10-CM

## 2022-12-20 DIAGNOSIS — O98313 Other infections with a predominantly sexual mode of transmission complicating pregnancy, third trimester: Secondary | ICD-10-CM | POA: Diagnosis not present

## 2022-12-20 DIAGNOSIS — O99013 Anemia complicating pregnancy, third trimester: Secondary | ICD-10-CM | POA: Diagnosis not present

## 2022-12-20 DIAGNOSIS — O26843 Uterine size-date discrepancy, third trimester: Secondary | ICD-10-CM | POA: Insufficient documentation

## 2022-12-20 DIAGNOSIS — O98513 Other viral diseases complicating pregnancy, third trimester: Secondary | ICD-10-CM

## 2022-12-20 DIAGNOSIS — Z362 Encounter for other antenatal screening follow-up: Secondary | ICD-10-CM | POA: Diagnosis not present

## 2022-12-20 DIAGNOSIS — Z148 Genetic carrier of other disease: Secondary | ICD-10-CM | POA: Diagnosis not present

## 2022-12-20 DIAGNOSIS — B009 Herpesviral infection, unspecified: Secondary | ICD-10-CM

## 2022-12-20 DIAGNOSIS — A6004 Herpesviral vulvovaginitis: Secondary | ICD-10-CM

## 2022-12-20 MED ORDER — VALACYCLOVIR HCL 500 MG PO TABS: 500.0000 mg | ORAL_TABLET | Freq: Two times a day (BID) | ORAL | 1 refills | Status: DC

## 2022-12-20 NOTE — Progress Notes (Signed)
   PRENATAL VISIT NOTE  Subjective:  Teresa Burns is a 37 y.o. G2P1001 at [redacted]w[redacted]d being seen today for ongoing prenatal care.  She is currently monitored for the following issues for this low-risk pregnancy and has Herpes genitalis; Supervision of other normal pregnancy, antepartum; Advanced maternal age in multigravida; Sickle cell trait in mother affecting pregnancy (HCC); and History of macrosomia in infant in prior pregnancy, currently pregnant on their problem list.  Patient reports no complaints.  Contractions: Irritability. Vag. Bleeding: None.  Movement: Present. Denies leaking of fluid.   The following portions of the patient's history were reviewed and updated as appropriate: allergies, current medications, past family history, past medical history, past social history, past surgical history and problem list.   Objective:   Vitals:   12/20/22 0858  BP: 137/86  Pulse: (!) 105  Weight: 180 lb (81.6 kg)   Fetal Status: Fetal Heart Rate (bpm): 122   Movement: Present     General:  Alert, oriented and cooperative. Patient is in no acute distress.  Skin: Skin is warm and dry. No rash noted.   Cardiovascular: Normal heart rate noted  Respiratory: Normal respiratory effort, no problems with respiration noted  Abdomen: Soft, gravid, appropriate for gestational age.  Pain/Pressure: Present     SCE performed w/ chaperone present. 1/l/h  Assessment and Plan:  Pregnancy: G2P1001 at [redacted]w[redacted]d 1. Supervision of other normal pregnancy, antepartum 2. [redacted] weeks gestation of pregnancy MOC: mini pill as bridge to patch - Culture, beta strep (group b only)  3. History of third degree perineal laceration Previously considered pLTCS, now desires trial of labor  4. History of macrosomia in infant in prior pregnancy, currently pregnant AGA on 6/13. Growth [place] scheduled for today  5. Herpes simplex vulvovaginitis Valtrex suppression rx sent  6. Multigravida of advanced maternal age in third  trimester  7. Sickle cell trait in mother affecting pregnancy [facility])  Please refer to After Visit Summary for other counseling recommendations.   Return in about 1 week (around 12/27/2022) for return OB at 37 weeks.  Future Appointments  Date Time Provider Department Center  12/20/2022 11:15 AM [facility] NURSE [facility]  12/20/2022 11:30 AM [facility] US3 [facility]  12/22/2022  8:00 AM Manfred Shirts, PTA [facility] None  12/27/2022  9:35 AM Lennart Pall, MD [facility] None  12/30/2022 12:30 PM Menke, BMWUXLK, PT [facility] None  01/03/2023  8:55 AM Constant, Gigi Gin, MD [facility] None  01/04/2023 12:30 PM Menke, GMWNUUV, PT [facility] None  01/10/2023  9:55 AM Adam Phenix, MD [facility] None  01/17/2023 10:35 AM Constant, Gigi Gin, MD [facility] None  01/24/2023  9:55 AM Constant, Gigi Gin, MD [facility] None   Lennart Pall, MD

## 2022-12-20 NOTE — Progress Notes (Signed)
Pt presents for ROB 36 week GBS collected today

## 2022-12-22 ENCOUNTER — Encounter: Payer: Self-pay | Admitting: Physical Therapy

## 2022-12-22 ENCOUNTER — Ambulatory Visit: Payer: Medicaid Other | Admitting: Physical Therapy

## 2022-12-22 DIAGNOSIS — M6281 Muscle weakness (generalized): Secondary | ICD-10-CM

## 2022-12-22 DIAGNOSIS — M5432 Sciatica, left side: Secondary | ICD-10-CM | POA: Diagnosis not present

## 2022-12-22 DIAGNOSIS — R279 Unspecified lack of coordination: Secondary | ICD-10-CM

## 2022-12-22 DIAGNOSIS — M62838 Other muscle spasm: Secondary | ICD-10-CM

## 2022-12-22 NOTE — Therapy (Addendum)
OUTPATIENT PHYSICAL THERAPY TREATMENT NOTE   Patient Name: Teresa Burns MRN: [id number] DOB:08/25/1985, 37 y.o., female Today's Date: 12/22/2022  END OF SESSION:  PT End of Session - 12/22/22 1246     Visit Number 8    Date for PT Re-Evaluation 01/04/23    Authorization Type Medicaid    Authorization Time Period 10/12/2022 - 01/10/2023    Authorization - Number of Visits 10    PT Start Time 1205   pt late, moving slow, having intermittent contractions pt reports   PT Stop Time 1230    PT Time Calculation (min) 25 min    Activity Tolerance Patient tolerated treatment well;Other (comment)   Pt having contractions but not close together. Pt had 2 during her aquatic time.   Behavior During Therapy [facility] for tasks assessed/performed                Past Medical History:  Diagnosis Date   Herpes    Pleuritic chest pain 07/15/2015   Sickle cell trait [facility])    Past Surgical History:  Procedure Laterality Date   NO PAST SURGERIES     Patient Active Problem List   Diagnosis Date Noted   History of macrosomia in infant in prior pregnancy, currently pregnant 12/13/2022   Sickle cell trait in mother affecting pregnancy (HCC) 10/20/2022   Advanced maternal age in multigravida 10/08/2022   Supervision of other normal pregnancy, antepartum 06/08/2022   Herpes genitalis 07/15/2015     REFERRING PROVIDER: Mariel Aloe MD  REFERRING DIAG: O26.899, M79.606 pregnancy related leg pain, sciatica  Rationale for Evaluation and Treatment: Rehabilitation  THERAPY DIAG:  sciatica ONSET DATE: February 2024  SUBJECTIVE:                                                                                                                                                                                           SUBJECTIVE STATEMENT: I want to have this baby today, that's why I'm here, lets walk!     PERTINENT HISTORY:  Pregnant [redacted] weeks due in August; normal pregnancy no precautions Son  is 43 years old Has access to a pool PAIN:  PAIN:  Are you having pain? No, pressure NPRS scale:    Pain location: Aggravating factors: bending over, getting in/out of car;  squatting, walking too fast Relieving factors: deep massage to piriformis, stretching but hard to get in positions now due to pregnancy   PRECAUTIONS: Other: pregnancy  WEIGHT BEARING RESTRICTIONS: No  FALLS:  Has patient fallen in last 6 months? No  LIVING ENVIRONMENT: Lives with: lives with their family Lives in: House/apartment  OCCUPATION: stay  at home  PLOF: Independent  PATIENT GOALS: pain relief for remainder of pregnancy    OBJECTIVE:   PATIENT SURVEYS:  Eval:  Modified Oswestry 56% self perceived disability  12/08/2022:  Modified Oswestry Low Back Pain Disability Questionnaire: 11 / 50 = 22.0 %  COGNITION: Overall cognitive status: Within functional limits for tasks assessed    PALPATION: Multiple tender points left gluteals  LUMBAR ROM:   AROM eval  Flexion WFLS  Extension 15  Right lateral flexion 30  Left lateral flexion 30  Right rotation   Left rotation    (Blank rows = not tested)  TRUNK STRENGTH:  Decreased activation of transverse abdominus muscles; abdominals 4-/5; decreased activation of lumbar multifidi; trunk extensors 4-/5  LOWER EXTREMITY ROM:   WFLS  LOWER EXTREMITY MMT:   Left hip abduction 3+/5; left knee extension 4-/5, left knee flexion 4-/5  FUNCTIONAL TESTS:  Unable to rise from sit to stand without moderate UE assist Pelvic drop on left and lateral trunk lean with SLS  Negative slump test GAIT:  Comments: antalgic, decreased gait speed  TODAY'S TREATMENT:     12/22/22:   Pt arrives for aquatic physical therapy. Treatment took place in 3.5-5.5 feet of water. Water temperature was 91 degrees F. Pt entered the pool via stairs independently with use of handrails.  75% submersion water walking with natural arms 8x each direction. Plie squats 3x10.  Horizontal float x 5 min.Extra time to get out of pool.                                                                                                                          DATE: 12/14/2022 Nustep level 4 x7 min with PT present to discuss status Standing with forward lean over green pball on mat performing lateral side sway x2 min Seated on blue pball pelvic circles CW and CCW / forward and backward x20 each way Seated side stretch over pink peanut x10 bilat Side-lying reverse clamshell with pink peanut ball x10 bilat Side-lying clamshell 2x10 bilat Quadruped TA contraction 2x10 Quadruped tail wags 2x10 Supine butterfly stretch 3 x 20 sec  Supine clamshell with green tband 2x10   DATE: 12/08/2022 Nustep level 4 x6 min with PT present to discuss status Seated hamstring stretch 2x20 sec bilat (with cuing for technique/posture and breathing) Seated piriformis stretch 2x20 sec bilat Seated sciatic nerve tensioner 2x10 bilat Seated on blue pball pelvic circles CW and CCW / forward and backward x10 each way Seated hip adduction ball squeeze 2 x10 Standing with forward lean over green pball on mat performing lateral side sway x2 min Quadruped Cat and cow 2 x 10  Quadruped tail wags 2 x 10 Supine butterfly stretch 3 x 20 sec  Supine clamshells with blue resistance loop 3 x 10     PATIENT EDUCATION:  Education details: Educated patient on anatomy and physiology of current symptoms, prognosis, plan of care as well as initial self care strategies to promote  recovery; discussed using a Belly Band support Person educated: Patient Education method: Explanation Education comprehension: verbalized understanding  HOME EXERCISE PROGRAM: Access Code: [email or web address] URL: [email or web address] Date: 12/08/2022 Prepared by: Clydie Braun Menke  Exercises - Seated Table Piriformis Stretch  - 1 x daily - 7 x weekly - 1 sets - 3 reps - 30 hold - Child's Pose with Sidebending  - 1 x daily - 7  x weekly - 1 sets - 3 reps - 30 hold - Quadruped Cat Cow  - 1 x daily - 7 x weekly - 1 sets - 10 reps - Seated Piriformis Stretch with Trunk Bend  - 1 x daily - 7 x weekly - 1 sets - 3 reps - 30 hold - Clamshell  - 1 x daily - 7 x weekly - 2 sets - 10 reps - Sidelying Hip Adduction Isometric with Ball  - 1 x daily - 7 x weekly - 2 sets - 10 reps - Seated core activation with ball press  - 1 x daily - 7 x weekly - 1 sets - 10 reps - Seated Hip Adduction Squeeze with Ball  - 1 x daily - 7 x weekly - 1 sets - 10 reps - Seated Isometric Hip Abduction with Resistance  - 1 x daily - 7 x weekly - 1 sets - 10 reps - Pelvic Tilt on Swiss Ball  - 1 x daily - 7 x weekly - 2 sets - 10 reps - Seated Lateral Pelvic Tilt on Swiss Ball  - 1 x daily - 7 x weekly - 2 sets - 10 reps - Pelvic Circles on Swiss Ball  - 1 x daily - 7 x weekly - 2 sets - 10 reps  ASSESSMENT:  CLINICAL IMPRESSION: Pt arrives late to aquatic PT but pt is at point where she could deliver baby at any moment. Pt's goal was to exercise in the water in hopes of having no increased pain while encouraging the progression of labor. Pt had 2 smaller contractions while exercising in the pool and no pain. Transitioning back to gravity/land was difficult.   GOALS: Goals reviewed with patient? Yes  SHORT TERM GOALS: Target date: 11/23/2022    The patient will demonstrate knowledge of basic self care strategies and exercises to promote healing   Baseline: Goal status: MET  2.  Patient will be able to walk medium distances with pain level 5/10 or less Baseline:  Goal status: MET  3.  The patient will have improved hip strength to at least 4/5 needed for standing, walking longer distances and descending stairs at home and in the community  Baseline:  Goal status: MET     LONG TERM GOALS: Target date: 01/04/2023    The patient will be independent in a safe self progression of a home exercise program and self care strategies to promote  further recovery of function  Baseline:  Goal status: MET  2.  Patient will be able to walk 20 min with pain 4/10 Baseline:  Goal status: IN PROGRESS  3.  The patient will have improved hip strength to at least 4+/5 needed for standing, walking longer distances and descending stairs at home and in the community  Baseline:  Goal status: IN PROGRESS  4.  The patient will have improved trunk flexor and extensor muscle strength to at least 4+/5 needed for lifting her future newborn and baby carrier Baseline:  Goal status: IN PROGRESS  5.  Modified Oswestry improved to 46% Baseline:  Goal  status: MET on 12/08/2022   PLAN:  PT FREQUENCY: 1x/week  PT DURATION: 12 weeks  PLANNED INTERVENTIONS: Therapeutic exercises, Therapeutic activity, Neuromuscular re-education, Patient/Family education, Self Care, Joint mobilization, Aquatic Therapy, Dry Needling, Electrical stimulation, Cryotherapy, Moist heat, Taping, Manual therapy, and Re-evaluation.  PLAN FOR NEXT SESSION: Aquatic PT; continue to progress pelvic stability exercises; reinforce concepts of breathing with movement for better pressure management.    Ane Payment, PTA 12/22/22 12:48 PM  PHYSICAL THERAPY DISCHARGE SUMMARY  Visits from Start of Care: 8  Current functional level related to goals / functional outcomes: See above.  Pt didn't return to PT.    Remaining deficits: See above for most current status.    Education / Equipment: HEP   Patient agrees to discharge. Patient goals were partially met. Patient is being discharged due to not returning since the last visit.  Lorrene Reid, PT 06/30/23 3:22 PM   [facility] [address], [address] [place], [place] [address] Phone # [phone] Fax [phone]

## 2022-12-23 ENCOUNTER — Encounter: Payer: Medicaid Other | Admitting: Rehabilitative and Restorative Service Providers"

## 2022-12-27 ENCOUNTER — Ambulatory Visit (INDEPENDENT_AMBULATORY_CARE_PROVIDER_SITE_OTHER): Payer: Medicaid Other | Admitting: Obstetrics and Gynecology

## 2022-12-27 VITALS — BP 118/78 | HR 114 | Wt 182.0 lb

## 2022-12-27 DIAGNOSIS — O09523 Supervision of elderly multigravida, third trimester: Secondary | ICD-10-CM

## 2022-12-27 DIAGNOSIS — Z8759 Personal history of other complications of pregnancy, childbirth and the puerperium: Secondary | ICD-10-CM

## 2022-12-27 DIAGNOSIS — O99019 Anemia complicating pregnancy, unspecified trimester: Secondary | ICD-10-CM

## 2022-12-27 DIAGNOSIS — Z3A37 37 weeks gestation of pregnancy: Secondary | ICD-10-CM

## 2022-12-27 DIAGNOSIS — O09299 Supervision of pregnancy with other poor reproductive or obstetric history, unspecified trimester: Secondary | ICD-10-CM

## 2022-12-27 DIAGNOSIS — Z3480 Encounter for supervision of other normal pregnancy, unspecified trimester: Secondary | ICD-10-CM

## 2022-12-27 DIAGNOSIS — A6004 Herpesviral vulvovaginitis: Secondary | ICD-10-CM

## 2022-12-27 DIAGNOSIS — D573 Sickle-cell trait: Secondary | ICD-10-CM

## 2022-12-27 HISTORY — DX: Personal history of other complications of pregnancy, childbirth and the puerperium: Z87.59

## 2022-12-27 NOTE — Progress Notes (Signed)
   PRENATAL VISIT NOTE  Subjective:  Teresa Burns is a 37 y.o. G2P1001 at [redacted]w[redacted]d being seen today for ongoing prenatal care.  She is currently monitored for the following issues for this low-risk pregnancy and has Herpes genitalis; Supervision of other normal pregnancy, antepartum; Advanced maternal age in multigravida; Sickle cell trait in mother affecting pregnancy (HCC); and History of macrosomia in infant in prior pregnancy, currently pregnant on their problem list.  Patient reports backache.  Contractions: Irregular. Vag. Bleeding: None.  Movement: Present. Denies leaking of fluid.   The following portions of the patient's history were reviewed and updated as appropriate: allergies, current medications, past family history, past medical history, past social history, past surgical history and problem list.   Objective:   Vitals:   12/27/22 0931  BP: 118/78  Pulse: (!) 114  Weight: 182 lb (82.6 kg)    Fetal Status: Fetal Heart Rate (bpm): 150   Movement: Present  Presentation: Vertex  General:  Alert, oriented and cooperative. Patient is in no acute distress.  Skin: Skin is warm and dry. No rash noted.   Cardiovascular: Normal heart rate noted  Respiratory: Normal respiratory effort, no problems with respiration noted  Abdomen: Soft, gravid, appropriate for gestational age.  Pain/Pressure: Present      Assessment and Plan:  Pregnancy: G2P1001 at [redacted]w[redacted]d 1. Supervision of other normal pregnancy, antepartum 2. [redacted] weeks gestation of pregnancy SCE 1/40/-3 - performed w/ chaperone present [employer] discussed Planning membrane sweep at 39w if no spontaneous labor  3. Herpes simplex vulvovaginitis Valtrex ppx  4. History of macrosomia in infant in prior pregnancy, currently pregnant 7. History of third degree Planning trial of labor @36 /3: 2750g (34%), AC 42%, AFI 20.02, cephalic, posterior  5. Multigravida of advanced maternal age in third trimester  6. Sickle cell trait  in mother affecting pregnancy [facility])  Term labor symptoms and general obstetric precautions including but not limited to vaginal bleeding, contractions, leaking of fluid and fetal movement were reviewed in detail with the patient.  Please refer to After Visit Summary for other counseling recommendations.   Return in about 1 week (around 01/03/2023) for return OB at 38 weeks.  Future Appointments  Date Time Provider Department Center  12/30/2022 12:30 PM Reather Laurence, PT [facility] None  01/03/2023  8:55 AM Constant, Gigi Gin, MD [facility] None  01/04/2023 12:30 PM Menke, [place], PT [facility] None  01/10/2023  9:55 AM Adam Phenix, MD [facility] None  01/17/2023 10:35 AM Constant, Gigi Gin, MD [facility] None  01/24/2023  9:55 AM Constant, Gigi Gin, MD [facility] None    Lennart Pall, MD

## 2022-12-27 NOTE — Patient Instructions (Signed)
[email or web address] - exercises to get your body ready for labor

## 2022-12-30 ENCOUNTER — Ambulatory Visit: Payer: Medicaid Other | Admitting: Rehabilitative and Restorative Service Providers"

## 2023-01-02 ENCOUNTER — Encounter: Payer: Self-pay | Admitting: Obstetrics and Gynecology

## 2023-01-03 ENCOUNTER — Encounter: Payer: Self-pay | Admitting: Obstetrics and Gynecology

## 2023-01-03 ENCOUNTER — Ambulatory Visit (INDEPENDENT_AMBULATORY_CARE_PROVIDER_SITE_OTHER): Payer: Medicaid Other | Admitting: Obstetrics and Gynecology

## 2023-01-03 VITALS — BP 123/83 | HR 112 | Wt 187.0 lb

## 2023-01-03 DIAGNOSIS — Z3A38 38 weeks gestation of pregnancy: Secondary | ICD-10-CM

## 2023-01-03 DIAGNOSIS — A6004 Herpesviral vulvovaginitis: Secondary | ICD-10-CM

## 2023-01-03 DIAGNOSIS — O09523 Supervision of elderly multigravida, third trimester: Secondary | ICD-10-CM

## 2023-01-03 DIAGNOSIS — Z3480 Encounter for supervision of other normal pregnancy, unspecified trimester: Secondary | ICD-10-CM

## 2023-01-03 DIAGNOSIS — O09299 Supervision of pregnancy with other poor reproductive or obstetric history, unspecified trimester: Secondary | ICD-10-CM

## 2023-01-03 DIAGNOSIS — Z8759 Personal history of other complications of pregnancy, childbirth and the puerperium: Secondary | ICD-10-CM

## 2023-01-03 NOTE — Progress Notes (Signed)
   PRENATAL VISIT NOTE  Subjective:  Teresa Burns is a 37 y.o. G2P1001 at [redacted]w[redacted]d being seen today for ongoing prenatal care.  She is currently monitored for the following issues for this low-risk pregnancy and has Herpes genitalis; Supervision of other normal pregnancy, antepartum; Advanced maternal age in multigravida; Sickle cell trait in mother affecting pregnancy (HCC); History of macrosomia in infant in prior pregnancy, currently pregnant; and History of third degree perineal laceration on their problem list.  Patient reports no complaints.  Contractions: Irregular. Vag. Bleeding: None.  Movement: Present. Denies leaking of fluid.   The following portions of the patient's history were reviewed and updated as appropriate: allergies, current medications, past family history, past medical history, past social history, past surgical history and problem list.   Objective:   Vitals:   01/03/23 0852  BP: 123/83  Pulse: (!) 112  Weight: 187 lb (84.8 kg)    Fetal Status: Fetal Heart Rate (bpm): 145   Movement: Present     General:  Alert, oriented and cooperative. Patient is in no acute distress.  Skin: Skin is warm and dry. No rash noted.   Cardiovascular: Normal heart rate noted  Respiratory: Normal respiratory effort, no problems with respiration noted  Abdomen: Soft, gravid, appropriate for gestational age.  Pain/Pressure: Present     Pelvic: Cervical exam deferred        Extremities: Normal range of motion.     Mental Status: Normal mood and affect. Normal behavior. Normal judgment and thought content.   Assessment and Plan:  Pregnancy: G2P1001 at [redacted]w[redacted]d 1. Supervision of other normal pregnancy, antepartum Patient is doing well without complaints Membrane sweep at 39 weeks  2. Herpes simplex vulvovaginitis Continue Valtrex for prophylaxis  3. History of third degree perineal laceration   4. Multigravida of advanced maternal age in third trimester   5. History of  macrosomia in infant in prior pregnancy, currently pregnant 7/22 EFW 2750 gm (34%tile)  Term labor symptoms and general obstetric precautions including but not limited to vaginal bleeding, contractions, leaking of fluid and fetal movement were reviewed in detail with the patient. Please refer to After Visit Summary for other counseling recommendations.   Return in about 1 week (around 01/10/2023) for in person, ROB, Low risk.  Future Appointments  Date Time Provider Department Center  01/04/2023 12:30 PM Reather Laurence, PT [facility] None  01/10/2023  9:55 AM Adam Phenix, MD [facility] None  01/17/2023 10:35 AM Adeline Petitfrere, Gigi Gin, MD [facility] None  01/24/2023  9:55 AM Alysabeth Scalia, Gigi Gin, MD [facility] None    Catalina Antigua, MD

## 2023-01-04 ENCOUNTER — Ambulatory Visit: Payer: Medicaid Other | Admitting: Rehabilitative and Restorative Service Providers"

## 2023-01-05 ENCOUNTER — Encounter: Payer: Self-pay | Admitting: Obstetrics

## 2023-01-06 ENCOUNTER — Inpatient Hospital Stay (HOSPITAL_COMMUNITY)
Admission: AD | Admit: 2023-01-06 | Discharge: 2023-01-06 | Disposition: A | Discharge status: Home / Self Care | Payer: Medicaid Other | Attending: Obstetrics and Gynecology | Admitting: Obstetrics and Gynecology

## 2023-01-06 ENCOUNTER — Encounter (HOSPITAL_COMMUNITY): Payer: Self-pay | Admitting: Obstetrics and Gynecology

## 2023-01-06 DIAGNOSIS — O26893 Other specified pregnancy related conditions, third trimester: Secondary | ICD-10-CM | POA: Insufficient documentation

## 2023-01-06 DIAGNOSIS — Z3A38 38 weeks gestation of pregnancy: Secondary | ICD-10-CM | POA: Diagnosis not present

## 2023-01-06 DIAGNOSIS — M7989 Other specified soft tissue disorders: Secondary | ICD-10-CM | POA: Diagnosis not present

## 2023-01-06 LAB — COMPREHENSIVE METABOLIC PANEL
ALT: 11 U/L (ref 0–44)
AST: 16 U/L (ref 15–41)
Albumin: 2.5 g/dL — ABNORMAL LOW (ref 3.5–5.0)
Alkaline Phosphatase: 76 U/L (ref 38–126)
Anion gap: 7 (ref 5–15)
BUN: 6 mg/dL (ref 6–20)
CO2: 22 mmol/L (ref 22–32)
Calcium: 8.4 mg/dL — ABNORMAL LOW (ref 8.9–10.3)
Chloride: 106 mmol/L (ref 98–111)
Creatinine, Ser: 0.67 mg/dL (ref 0.44–1.00)
GFR, Estimated: >60 mL/min (ref >60)
Glucose, Bld: 86 mg/dL (ref 70–99)
Potassium: 3.6 mmol/L (ref 3.5–5.1)
Sodium: 135 mmol/L (ref 135–145)
Total Bilirubin: 0.5 mg/dL (ref 0.3–1.2)
Total Protein: 6.1 g/dL — ABNORMAL LOW (ref 6.5–8.1)

## 2023-01-06 LAB — CBC
HCT: 30.7 % — ABNORMAL LOW (ref 36.0–46.0)
Hemoglobin: 10.3 g/dL — ABNORMAL LOW (ref 12.0–15.0)
MCH: 28.8 pg (ref 26.0–34.0)
MCHC: 33.6 g/dL (ref 30.0–36.0)
MCV: 85.8 fL (ref 80.0–100.0)
Platelets: 219 10*3/uL (ref 150–400)
RBC: 3.58 MIL/uL — ABNORMAL LOW (ref 3.87–5.11)
RDW: 14.1 % (ref 11.5–15.5)
WBC: 7.6 10*3/uL (ref 4.0–10.5)
nRBC: 0 % (ref 0.0–0.2)

## 2023-01-06 LAB — URINALYSIS, ROUTINE W REFLEX MICROSCOPIC
Bilirubin Urine: NEGATIVE
Glucose, UA: NEGATIVE mg/dL
Hgb urine dipstick: NEGATIVE
Ketones, ur: NEGATIVE mg/dL
Leukocytes,Ua: NEGATIVE
Nitrite: NEGATIVE
Protein, ur: NEGATIVE mg/dL
Specific Gravity, Urine: 1.005 (ref 1.005–1.030)
pH: 7 (ref 5.0–8.0)

## 2023-01-06 LAB — PROTEIN / CREATININE RATIO, URINE
Creatinine, Urine: 38 mg/dL
Total Protein, Urine: <6 mg/dL

## 2023-01-06 LAB — URIC ACID: Uric Acid, Serum: 4.1 mg/dL (ref 2.5–7.1)

## 2023-01-06 LAB — LACTATE DEHYDROGENASE: LDH: 168 U/L (ref 98–192)

## 2023-01-06 NOTE — Discharge Instructions (Signed)
Do nightly stretches based on the print out material for carpal tunnel.  Take magnesium oxide 200mg  - 400mg  every night to help with swelling, numbness, anxiety and constipation.

## 2023-01-06 NOTE — MAU Note (Addendum)
.  Teresa Burns is a 37 y.o. at [redacted]w[redacted]d here in MAU reporting:  Pt looked in their Mychart and saw a message from a nurse stating that she needed to come to the hospital due to pt reporting her symptoms of feet and hands  swelling. Pt stated she has had this swelling for two weeks now but didn't think anything of it. Pt states now it is hard to walk.  Pt stated she had a headache this morning but took tylenol and it went away.   Pt is suppose to have her membrane sweep tomorrow but now it wont be until Monday due to the weather.    Currently no pain. No epigastric pain  No blurry vision.  No LOF  No Bleeding.  +FM Vitals:   01/06/23 1924  BP: 127/83  Pulse: (!) 107  Resp: 17  Temp: 98.1 F (36.7 C)  SpO2: 97%      FHT:144 Lab orders placed from triage:  U/a

## 2023-01-06 NOTE — MAU Provider Note (Addendum)
Event Date/Time   First Provider Initiated Contact with Patient 01/06/23 1939      S Ms. Teresa Burns is a 37 y.o. G22P1001 pregnant/non-pregnant female at [redacted]w[redacted]d who presents to MAU today with complaint of increased swelling in b/l hands and feet.  She states that she has had swelling previously as she has been struggling with carpal tunnel syndrome for several weeks now without much relief despite wrist splints. She became worried as she remains swollen and no relief with relaxation as previous.  Denies change in hx of HA, CP, SOB, RUQ pain.  VSS, HDS, SORA.  Denies VB, LOF.  Reports + FM.    Patient Active Problem List   Diagnosis Date Noted   History of third degree perineal laceration 12/27/2022   History of macrosomia in infant in prior pregnancy, currently pregnant 12/13/2022   Sickle cell trait in mother affecting pregnancy (HCC) 10/20/2022   Advanced maternal age in multigravida 10/08/2022   Supervision of other normal pregnancy, antepartum 06/08/2022   Herpes genitalis 07/15/2015         Nursing Staff Provider  Office Location [facility] Dating  01/14/2023, by Last Menstrual Period  [facility] Model Arly.Keller ] Traditional [ ]  Centering [ ]  Mom-Baby Dyad      Language  English Anatomy US     Flu Vaccine   No 2023/24 Genetic/Carrier Screen  NIPS:    AFP:   negative 18 weeks Horizon:  TDaP Vaccine   Declines 11/25/22 Hgb A1C or  GTT Early  Third trimester : 2 hour normal  COVID Vaccine  No   LAB RESULTS   Rhogam  O/Positive/-- (01/09 1612)  Blood Type O/Positive/-- (01/09 1612)   Baby Feeding Plan  Breast Antibody Negative (01/09 1612)  Contraception Mini pill > Patch  Rubella 1.68 (01/09 1612)  Circumcision  Yes if female RPR Non Reactive (05/28 0940)   Pediatrician   [facility] HBsAg Negative (01/09 1612)   Support Person   HCVAb Non Reactive (01/09 1612)   Prenatal Classes   HIV Non Reactive (05/28 0940)     BTL Consent   GBS (For PCN allergy, check sensitivities)   VBAC Consent    Pap       Diagnosis  Date Value Ref Range Status  07/15/2022     Final    - Negative for intraepithelial lesion or malignancy (NILM)             DME Rx Arly.Keller ] BP cuff [ ]  Weight Scale Waterbirth  [ ]  Class [ ]  Consent [ ]  CNM visit  PHQ9 & GAD7 [ x ] new OB [ X ] 28 weeks  [  X] 36 weeks Induction  [ ]  Orders Entered [ ] Foley Y/N     Receives care at [facility]. Prenatal records reviewed.  Pertinent items noted in HPI and remainder of comprehensive ROS otherwise negative.   O BP 121/76   Pulse (!) 101   Temp 98.1 F (36.7 C) (Oral)   Resp 17   Ht 5' (1.524 m)   Wt 85.6 kg   LMP 04/09/2022   SpO2 97%   BMI 36.87 kg/m   Constitutional: Well-developed, well-nourished pregnant female in no acute distress.  HEENT: PERRLA Skin: normal color and turgor, no rash Cardiovascular: normal rate & rhythm, warm and well perfused, trace edema in b/l hands / feet Respiratory: normal effort, no problems with respiration noted GI: Abd soft, non-tender MS: Extremities nontender, no edema, normal ROM Neurologic: Alert and  oriented x 4.  GU: no CVA tenderness Pelvic: NEFG, physiologic discharge, no blood, 1/50%/H  NST: 120bpm, accel +, moderate variability, no decels, CTX every 8-20mins, Cat 1 reassuring     MDM: Low MAU Course: Patient presents for continued swelling of hands and feet.    A&P #[redacted] weeks gestation: Nst reassuring, cervical check largely unchanged and irregular contractions on toco.    Has ROB f/u 8/12, encouraged to keep that appointment.   #Bilateral hand and feet swelling: Swelling not concerning today, given recommendations of magnesium supplementation at bedtime with stretches to try and help with quality of life. Normotensive here.   - f/u baseline PreE labs   Discharge from MAU in stable condition if PreE labs reassuring with strict, usual return precautions Follow up at previously scheduled appointment for ongoing prenatal care  Allergies as of 01/06/2023   No  Known Allergies      Medication List     TAKE these medications    Blood Pressure Kit Devi 1 Device by Does not apply route once a week.   cyclobenzaprine 10 MG tablet Commonly known as: FLEXERIL Take 1 tablet (10 mg total) by mouth 2 (two) times daily as needed for muscle spasms.   valACYclovir 500 MG tablet Commonly known as: Valtrex Take 1 tablet (500 mg total) by mouth 2 (two) times daily.   Vitafol Gummies 3.33-0.333-34.8 MG Chew Chew 3 tablets by mouth daily.        Hessie Dibble, MD 01/06/2023 8:38 PM

## 2023-01-09 ENCOUNTER — Encounter: Payer: Self-pay | Admitting: Obstetrics & Gynecology

## 2023-01-09 ENCOUNTER — Encounter (HOSPITAL_COMMUNITY): Payer: Self-pay | Admitting: Obstetrics and Gynecology

## 2023-01-09 ENCOUNTER — Other Ambulatory Visit: Payer: Self-pay

## 2023-01-09 ENCOUNTER — Inpatient Hospital Stay (HOSPITAL_COMMUNITY)
Admission: AD | Admit: 2023-01-09 | Discharge: 2023-01-09 | Disposition: A | Discharge status: Home / Self Care | Payer: Medicaid Other | Attending: Obstetrics & Gynecology | Admitting: Obstetrics & Gynecology

## 2023-01-09 DIAGNOSIS — O471 False labor at or after 37 completed weeks of gestation: Secondary | ICD-10-CM | POA: Diagnosis present

## 2023-01-09 DIAGNOSIS — Z3A39 39 weeks gestation of pregnancy: Secondary | ICD-10-CM | POA: Insufficient documentation

## 2023-01-09 DIAGNOSIS — O479 False labor, unspecified: Secondary | ICD-10-CM | POA: Diagnosis not present

## 2023-01-09 HISTORY — DX: Encounter for other specified aftercare: Z51.89

## 2023-01-09 LAB — URINALYSIS, ROUTINE W REFLEX MICROSCOPIC
Bilirubin Urine: NEGATIVE
Glucose, UA: NEGATIVE mg/dL
Hgb urine dipstick: NEGATIVE
Ketones, ur: NEGATIVE mg/dL
Leukocytes,Ua: NEGATIVE
Nitrite: NEGATIVE
Protein, ur: NEGATIVE mg/dL
Specific Gravity, Urine: 1.005 (ref 1.005–1.030)
pH: 6 (ref 5.0–8.0)

## 2023-01-09 MED ORDER — LACTATED RINGERS IV BOLUS
1000.0000 mL | Freq: Once | INTRAVENOUS | Status: AC
  Administered 2023-01-09: 1000 mL via INTRAVENOUS

## 2023-01-09 NOTE — Progress Notes (Signed)
None      S: Ms. YAILYN MAKOWSKI is a 37 y.o. G2P1001 at [redacted]w[redacted]d  who presents to MAU today complaining contractions q 3-5 minutes since early this morning. She denies vaginal bleeding. She endorsed LOF but negative ferning. She reports normal fetal movement.    O: BP 131/81 (BP Location: Left Arm)   Pulse 85   Temp 98.2 F (36.8 C) (Oral)   Resp 20   LMP 04/09/2022   SpO2 98%   Cervical exam:  Dilation: 3.5 Effacement (%): 50 Station: -3 Presentation: Vertex Exam by:: Valla Leaver, RN   Fetal Monitoring: Baseline: 135 bpm Variability: moderate Accelerations: + Decelerations: none, per nursing prior to fluids was concern for variable deceleration heard in the room  Contractions: initially 3-101mins but spaced 5-10 mins once fluids    A: SIUP at [redacted]w[redacted]d  False labor  P: Given usual, strict MAU return precautions.    Hessie Dibble, MD 01/09/2023 1:12 PM

## 2023-01-09 NOTE — MAU Note (Signed)
.  Teresa Burns is a 37 y.o. at [redacted]w[redacted]d here in MAU reporting: ctx since 0500 this morning.  Reports ctx were q15 min then q5 but are now q 10.  Denies LOF, reports mucous plug came out this morning.  States her membranes were swept on Thursday.    Onset of complaint: 0500 Pain score: 7 Vitals:   01/09/23 0922 01/09/23 0923  BP:  133/80  Pulse:  100  Resp:  17  Temp:  98.2 F (36.8 C)  SpO2: 97%      FHT:135

## 2023-01-10 ENCOUNTER — Ambulatory Visit (INDEPENDENT_AMBULATORY_CARE_PROVIDER_SITE_OTHER): Payer: Medicaid Other | Admitting: Obstetrics & Gynecology

## 2023-01-10 VITALS — BP 127/87 | HR 100 | Wt 189.0 lb

## 2023-01-10 DIAGNOSIS — Z3A39 39 weeks gestation of pregnancy: Secondary | ICD-10-CM

## 2023-01-10 DIAGNOSIS — O09523 Supervision of elderly multigravida, third trimester: Secondary | ICD-10-CM

## 2023-01-10 DIAGNOSIS — Z3480 Encounter for supervision of other normal pregnancy, unspecified trimester: Secondary | ICD-10-CM

## 2023-01-10 DIAGNOSIS — Z8759 Personal history of other complications of pregnancy, childbirth and the puerperium: Secondary | ICD-10-CM

## 2023-01-10 DIAGNOSIS — O09299 Supervision of pregnancy with other poor reproductive or obstetric history, unspecified trimester: Secondary | ICD-10-CM

## 2023-01-10 DIAGNOSIS — A6004 Herpesviral vulvovaginitis: Secondary | ICD-10-CM

## 2023-01-10 DIAGNOSIS — O09293 Supervision of pregnancy with other poor reproductive or obstetric history, third trimester: Secondary | ICD-10-CM

## 2023-01-10 NOTE — Progress Notes (Signed)
   PRENATAL VISIT NOTE  Subjective:  Teresa Burns is a 37 y.o. G2P1001 at [redacted]w[redacted]d being seen today for ongoing prenatal care.  She is currently monitored for the following issues for this high-risk pregnancy and has Herpes genitalis; Supervision of other normal pregnancy, antepartum; Advanced maternal age in multigravida; Sickle cell trait in mother affecting pregnancy (HCC); History of macrosomia in infant in prior pregnancy, currently pregnant; and History of third degree perineal laceration on their problem list.  Patient reports occasional contractions and mucus with scant blood stain .  Contractions: Irregular. Vag. Bleeding: Scant.  Movement: Present. Denies leaking of fluid.   The following portions of the patient's history were reviewed and updated as appropriate: allergies, current medications, past family history, past medical history, past social history, past surgical history and problem list.   Objective:   Vitals:   01/10/23 0951  BP: 127/87  Pulse: 100  Weight: 189 lb (85.7 kg)    Fetal Status: Fetal Heart Rate (bpm): 137   Movement: Present  Presentation: Vertex  General:  Alert, oriented and cooperative. Patient is in no acute distress.  Skin: Skin is warm and dry. No rash noted.   Cardiovascular: Normal heart rate noted  Respiratory: Normal respiratory effort, no problems with respiration noted  Abdomen: Soft, gravid, appropriate for gestational age.  Pain/Pressure: Present     Pelvic: Cervical exam performed in the presence of a chaperone Dilation: 3 Effacement (%): 40 Station: Ballotable  Extremities: Normal range of motion.  Edema: Mild pitting, slight indentation  Mental Status: Normal mood and affect. Normal behavior. Normal judgment and thought content.   Assessment and Plan:  Pregnancy: G2P1001 at 106w3d 1. History of macrosomia in infant in prior pregnancy, currently pregnant IOL elective at 40 weeks with favorable cx  2. Herpes simplex  vulvovaginitis Acyclovir  3. Multigravida of advanced maternal age in third trimester   4. Supervision of other normal pregnancy, antepartum   5. History of third degree perineal laceration   Term labor symptoms and general obstetric precautions including but not limited to vaginal bleeding, contractions, leaking of fluid and fetal movement were reviewed in detail with the patient. Please refer to After Visit Summary for other counseling recommendations.   Return in about 1 week (around 01/17/2023).  Future Appointments  Date Time Provider Department Center  01/17/2023 10:35 AM Leftwich-Kirby, Wilmer Floor, CNM [facility] None  01/26/2023  1:30 PM Hermina Staggers, MD [facility] None    Scheryl Darter, MD

## 2023-01-10 NOTE — Progress Notes (Signed)
ROB, c/o contractions, pain.  Pt wants her membranes sweep.

## 2023-01-11 ENCOUNTER — Inpatient Hospital Stay (HOSPITAL_COMMUNITY): Payer: Medicaid Other | Admitting: Anesthesiology

## 2023-01-11 ENCOUNTER — Inpatient Hospital Stay (HOSPITAL_COMMUNITY)
Admission: AD | Admit: 2023-01-11 | Discharge: 2023-01-13 | DRG: 806 | Disposition: A | Discharge status: Home / Self Care | Payer: Medicaid Other | Attending: Obstetrics and Gynecology | Admitting: Obstetrics and Gynecology

## 2023-01-11 ENCOUNTER — Other Ambulatory Visit: Payer: Self-pay

## 2023-01-11 ENCOUNTER — Encounter (HOSPITAL_COMMUNITY): Payer: Self-pay | Admitting: Obstetrics and Gynecology

## 2023-01-11 DIAGNOSIS — O4202 Full-term premature rupture of membranes, onset of labor within 24 hours of rupture: Secondary | ICD-10-CM | POA: Diagnosis not present

## 2023-01-11 DIAGNOSIS — O9902 Anemia complicating childbirth: Secondary | ICD-10-CM | POA: Diagnosis present

## 2023-01-11 DIAGNOSIS — Z87891 Personal history of nicotine dependence: Secondary | ICD-10-CM | POA: Diagnosis not present

## 2023-01-11 DIAGNOSIS — Z3490 Encounter for supervision of normal pregnancy, unspecified, unspecified trimester: Secondary | ICD-10-CM

## 2023-01-11 DIAGNOSIS — Z3A39 39 weeks gestation of pregnancy: Secondary | ICD-10-CM | POA: Diagnosis not present

## 2023-01-11 DIAGNOSIS — D573 Sickle-cell trait: Secondary | ICD-10-CM | POA: Diagnosis present

## 2023-01-11 DIAGNOSIS — O9832 Other infections with a predominantly sexual mode of transmission complicating childbirth: Secondary | ICD-10-CM | POA: Diagnosis present

## 2023-01-11 DIAGNOSIS — O09529 Supervision of elderly multigravida, unspecified trimester: Secondary | ICD-10-CM

## 2023-01-11 DIAGNOSIS — Z8759 Personal history of other complications of pregnancy, childbirth and the puerperium: Secondary | ICD-10-CM

## 2023-01-11 DIAGNOSIS — O4292 Full-term premature rupture of membranes, unspecified as to length of time between rupture and onset of labor: Principal | ICD-10-CM | POA: Diagnosis present

## 2023-01-11 DIAGNOSIS — A6000 Herpesviral infection of urogenital system, unspecified: Secondary | ICD-10-CM | POA: Diagnosis present

## 2023-01-11 DIAGNOSIS — O26893 Other specified pregnancy related conditions, third trimester: Secondary | ICD-10-CM | POA: Diagnosis present

## 2023-01-11 DIAGNOSIS — Z3480 Encounter for supervision of other normal pregnancy, unspecified trimester: Secondary | ICD-10-CM

## 2023-01-11 DIAGNOSIS — O09299 Supervision of pregnancy with other poor reproductive or obstetric history, unspecified trimester: Secondary | ICD-10-CM

## 2023-01-11 DIAGNOSIS — O99019 Anemia complicating pregnancy, unspecified trimester: Secondary | ICD-10-CM | POA: Diagnosis present

## 2023-01-11 HISTORY — DX: Encounter for supervision of normal pregnancy, unspecified, unspecified trimester: Z34.90

## 2023-01-11 HISTORY — DX: Headache, unspecified: R51.9

## 2023-01-11 LAB — CBC
HCT: 30.7 % — ABNORMAL LOW (ref 36.0–46.0)
Hemoglobin: 10.3 g/dL — ABNORMAL LOW (ref 12.0–15.0)
MCH: 27.9 pg (ref 26.0–34.0)
MCHC: 33.6 g/dL (ref 30.0–36.0)
MCV: 83.2 fL (ref 80.0–100.0)
Platelets: 214 10*3/uL (ref 150–400)
RBC: 3.69 MIL/uL — ABNORMAL LOW (ref 3.87–5.11)
RDW: 14.5 % (ref 11.5–15.5)
WBC: 7.7 10*3/uL (ref 4.0–10.5)
nRBC: 0.3 % — ABNORMAL HIGH (ref 0.0–0.2)

## 2023-01-11 LAB — TYPE AND SCREEN
ABO/RH(D): O POS
Antibody Screen: NEGATIVE

## 2023-01-11 LAB — POCT FERN TEST: POCT Fern Test: POSITIVE

## 2023-01-11 MED ORDER — OXYTOCIN-SODIUM CHLORIDE 30-0.9 UT/500ML-% IV SOLN
1.0000 m[IU]/min | INTRAVENOUS | Status: DC
  Administered 2023-01-11: 2 m[IU]/min via INTRAVENOUS
  Filled 2023-01-11: qty 500

## 2023-01-11 MED ORDER — ONDANSETRON HCL 4 MG/2ML IJ SOLN: 4.0000 mg | Freq: Four times a day (QID) | INTRAMUSCULAR | Status: DC | PRN

## 2023-01-11 MED ORDER — LIDOCAINE HCL (PF) 1 % IJ SOLN
INTRAMUSCULAR | Status: DC | PRN
  Administered 2023-01-11: 11 mL via EPIDURAL

## 2023-01-11 MED ORDER — DIPHENHYDRAMINE HCL 50 MG/ML IJ SOLN: 12.5000 mg | INTRAMUSCULAR | Status: DC | PRN

## 2023-01-11 MED ORDER — OXYCODONE-ACETAMINOPHEN 5-325 MG PO TABS: 2.0000 | ORAL_TABLET | ORAL | Status: DC | PRN

## 2023-01-11 MED ORDER — ACETAMINOPHEN 325 MG PO TABS: 650.0000 mg | ORAL_TABLET | ORAL | Status: DC | PRN

## 2023-01-11 MED ORDER — LACTATED RINGERS IV SOLN: INTRAVENOUS | Status: DC

## 2023-01-11 MED ORDER — OXYTOCIN-SODIUM CHLORIDE 30-0.9 UT/500ML-% IV SOLN: 2.5000 [IU]/h | INTRAVENOUS | Status: DC

## 2023-01-11 MED ORDER — BUPIVACAINE HCL (PF) 0.25 % IJ SOLN
INTRAMUSCULAR | Status: DC | PRN
  Administered 2023-01-11: 10 mL via EPIDURAL

## 2023-01-11 MED ORDER — FENTANYL CITRATE (PF) 100 MCG/2ML IJ SOLN
100.0000 ug | INTRAMUSCULAR | Status: DC | PRN
  Administered 2023-01-11 (×2): 100 ug via INTRAVENOUS
  Filled 2023-01-11 (×2): qty 2

## 2023-01-11 MED ORDER — TERBUTALINE SULFATE 1 MG/ML IJ SOLN: 0.2500 mg | Freq: Once | INTRAMUSCULAR | Status: DC | PRN

## 2023-01-11 MED ORDER — EPHEDRINE 5 MG/ML INJ: 10.0000 mg | INTRAVENOUS | Status: DC | PRN

## 2023-01-11 MED ORDER — SOD CITRATE-CITRIC ACID 500-334 MG/5ML PO SOLN: 30.0000 mL | ORAL | Status: DC | PRN

## 2023-01-11 MED ORDER — PHENYLEPHRINE 80 MCG/ML (10ML) SYRINGE FOR IV PUSH (FOR BLOOD PRESSURE SUPPORT): 80.0000 ug | PREFILLED_SYRINGE | INTRAVENOUS | Status: DC | PRN

## 2023-01-11 MED ORDER — LIDOCAINE HCL (PF) 1 % IJ SOLN: 30.0000 mL | INTRAMUSCULAR | Status: DC | PRN

## 2023-01-11 MED ORDER — LACTATED RINGERS IV SOLN
500.0000 mL | Freq: Once | INTRAVENOUS | Status: AC
  Administered 2023-01-11: 500 mL via INTRAVENOUS

## 2023-01-11 MED ORDER — OXYCODONE-ACETAMINOPHEN 5-325 MG PO TABS: 1.0000 | ORAL_TABLET | ORAL | Status: DC | PRN

## 2023-01-11 MED ORDER — LACTATED RINGERS IV SOLN: 500.0000 mL | INTRAVENOUS | Status: DC | PRN

## 2023-01-11 MED ORDER — FENTANYL-BUPIVACAINE-NACL 0.5-0.125-0.9 MG/250ML-% EP SOLN
12.0000 mL/h | EPIDURAL | Status: DC | PRN
  Administered 2023-01-11: 12 mL/h via EPIDURAL
  Filled 2023-01-11: qty 250

## 2023-01-11 MED ORDER — OXYTOCIN BOLUS FROM INFUSION: 333.0000 mL | Freq: Once | INTRAVENOUS | Status: DC

## 2023-01-11 NOTE — MAU Note (Signed)
Teresa Burns is a 37 y.o. at [redacted]w[redacted]d here in MAU reporting: started leaking at 0929, clear fluid, still coming. No bleeding.  Membranes stripped yesterday, was 3+cm, contractions during the night, more intense after ROM. Now every 4 min. No problems with preg.  Prior vag del at term. Onset of complaint: 0929 Pain score: 8 Vitals:   01/11/23 1104  BP: 118/73  Pulse: (!) 118  Resp: 18  Temp: 98.3 F (36.8 C)  SpO2: 98%     FHT:134 Lab orders placed from triage:  fern

## 2023-01-11 NOTE — Anesthesia Preprocedure Evaluation (Signed)
Anesthesia Evaluation    Airway Mallampati: II  TM Distance: >3 FB Neck ROM: Full    Dental no notable dental hx.    Pulmonary former smoker   Pulmonary exam normal breath sounds clear to auscultation       Cardiovascular Normal cardiovascular exam Rhythm:Regular Rate:Normal     Neuro/Psych  Headaches    GI/Hepatic   Endo/Other    Renal/GU      Musculoskeletal   Abdominal  (+) + obese  Peds  Hematology   Anesthesia Other Findings   Reproductive/Obstetrics (+) Pregnancy                             Anesthesia Physical Anesthesia Plan  ASA: 2  Anesthesia Plan: Epidural   Post-op Pain Management:    Induction:   PONV Risk Score and Plan:   Airway Management Planned:   Additional Equipment:   Intra-op Plan:   Post-operative Plan:   Informed Consent:   Plan Discussed with:   Anesthesia Plan Comments:        Anesthesia Quick Evaluation

## 2023-01-11 NOTE — Plan of Care (Signed)

## 2023-01-11 NOTE — Anesthesia Procedure Notes (Signed)
Epidural Patient location during procedure: OB Start time: 01/11/2023 7:25 PM End time: 01/11/2023 7:40 PM  Staffing Anesthesiologist: Lowella Curb, MD Performed: anesthesiologist   Preanesthetic Checklist Completed: patient identified, IV checked, site marked, risks and benefits discussed, surgical consent, monitors and equipment checked, pre-op evaluation and timeout performed  Epidural Patient position: sitting Prep: ChloraPrep Patient monitoring: heart rate, cardiac monitor, continuous pulse ox and blood pressure Approach: midline Location: L2-L3 Injection technique: LOR saline  Needle:  Needle type: Tuohy  Needle gauge: 17 G Needle length: 9 cm Needle insertion depth: 5 cm Catheter type: closed end flexible Catheter size: 20 Guage Catheter at skin depth: 9 cm Test dose: negative  Assessment Events: blood not aspirated, injection not painful, no injection resistance, no paresthesia and negative IV test  Additional Notes Reason for block:procedure for pain

## 2023-01-11 NOTE — Progress Notes (Signed)
Teresa Burns is a 36 y.o. G2P1001 at [redacted]w[redacted]d admitted for rupture of membranes  Subjective: She reports painful contractions every 2-4 minutes. She received one dose of IV pain meds which has relieved her pain. She is requesting a cervical check.   Objective: BP (!) 106/59   Pulse 86   Temp 98 F (36.7 C) (Oral)   Resp 19   Ht 5\' 1"  (1.549 m)   Wt 86.2 kg   LMP 04/09/2022   SpO2 100%   BMI 35.90 kg/m  No intake/output data recorded. No intake/output data recorded.  FHT: 115 bpm, moderate variability, +15x15 accels, no decels UC: Q4 mins  SVE:   Dilation: 3 Effacement (%): 50 Station: -2 Exam by:: Camelia Eng, CNM  Labs: Lab Results  Component Value Date   WBC 7.7 01/11/2023   HGB 10.3 (L) 01/11/2023   HCT 30.7 (L) 01/11/2023   MCV 83.2 01/11/2023   PLT 214 01/11/2023    Assessment / Plan: Teresa Burns is a 37 y.o. G2P1001 at [redacted]w[redacted]d admitted for SROM  Labor: Patient unchanged. Continue to increase Pitocin prn  Fetal Wellbeing:  Category I Pain Control:  prn per patient request I/D:  GBS neg Anticipated MOD:  NSVD  Brand Males, CNM 01/11/2023, 4:12 PM

## 2023-01-11 NOTE — H&P (Addendum)
OBSTETRIC ADMISSION HISTORY AND PHYSICAL  Teresa Burns is a 37 y.o. female G2P1001 with IUP at [redacted]w[redacted]d by LMP presenting for SROM 0929. She reports +FMs, No LOF, no VB, no blurry vision, headaches or peripheral edema, and RUQ pain.  She plans on breast feeding. She request the patch for birth control at her postpartum visit. She received her [facility]   Dating: By LMP --->  Estimated Date of Delivery: 01/14/23  Sono:    @[redacted]w[redacted]d , CWD, normal anatomy, cephalic presentation, posterior placenta, 2750g, 34% EFW   Prenatal History/Complications: previous delivery VAVD [phone] with 3c laceration and ?PPH, [place] trait, hx HSV  Past Medical History: Past Medical History:  Diagnosis Date   Blood transfusion without reported diagnosis    PPH last delivery per pt   Headache    Herpes    Pleuritic chest pain 07/15/2015   Sickle cell trait (HCC)     Past Surgical History: Past Surgical History:  Procedure Laterality Date   NO PAST SURGERIES      Obstetrical History: OB History     Gravida  2   Para  1   Term  1   Preterm  0   AB  0   Living  1      SAB  0   IAB  0   Ectopic  0   Multiple  0   Live Births  1           Social History Social History   Socioeconomic History   Marital status: Single    Spouse name: Not on file   Number of children: Not on file   Years of education: Not on file   Highest education level: Not on file  Occupational History   Not on file  Tobacco Use   Smoking status: Former    Current packs/day: 0.00    Types: Cigarettes    Quit date: 05/16/2022    Years since quitting: 0.6   Smokeless tobacco: Never   Tobacco comments:    1/2PPD  Vaping Use   Vaping status: Never Used  Substance and Sexual Activity   Alcohol use: Not Currently    Comment: pt states 8 to 10 shots nightly   Drug use: Not Currently    Types: Marijuana    Comment: last use 05/16/22   Sexual activity: Yes  Other Topics Concern   Not on file   Social History Narrative   Not on file   Social Determinants of Health   Financial Resource Strain: Not on file  Food Insecurity: Unknown (01/11/2023)   Hunger Vital Sign    Worried About Running Out of Food in the Last Year: Never true    Ran Out of Food in the Last Year: Not on file  Transportation Needs: No Transportation Needs (01/11/2023)   PRAPARE - Administrator, Civil Service (Medical): No    Lack of Transportation (Non-Medical): No  Physical Activity: Not on file  Stress: Not on file  Social Connections: Not on file    Family History: Family History  Problem Relation Age of Onset   Hypertension Mother    Bipolar disorder Mother    Stroke Maternal Grandmother    Diabetes Maternal Grandmother    Hypertension Maternal Grandmother    Cancer Maternal Grandmother     Allergies: No Known Allergies  Medications Prior to Admission  Medication Sig Dispense Refill Last Dose   Prenatal Vit-Fe Phos-FA-Omega (VITAFOL GUMMIES) 3.33-0.333-34.8 MG  CHEW Chew 3 tablets by mouth daily. 90 tablet 11 01/10/2023   valACYclovir (VALTREX) 500 MG tablet Take 1 tablet (500 mg total) by mouth 2 (two) times daily. 60 tablet 1 01/10/2023   Blood Pressure Monitoring (BLOOD PRESSURE KIT) DEVI 1 Device by Does not apply route once a week. (Patient not taking: Reported on 07/15/2022) 1 each 0    cyclobenzaprine (FLEXERIL) 10 MG tablet Take 1 tablet (10 mg total) by mouth 2 (two) times daily as needed for muscle spasms. 10 tablet 0    Review of Systems   All systems reviewed and negative except as stated in HPI  Blood pressure 127/85, pulse 96, temperature 98.3 F (36.8 C), temperature source Oral, resp. rate 18, height 5\' 1"  (1.549 m), weight 86.2 kg, last menstrual period 04/09/2022, SpO2 98%. General appearance: alert and no distress Lungs: no respiratory distress Presentation: cephalic bedside ultrasound performed by Camelia Eng, CNM Fetal monitoringBaseline: 150 bpm,  Variability: moderate, Accelerations: 15x15, and Decelerations: Absent Uterine activityFrequency: Every 2-3 minutes Dilation: 3 Effacement (%): 50 Station: -3 Exam by:: Newton   Prenatal labs: ABO, Rh: --/--/O POS (08/13 1143) Antibody: NEG (08/13 1143) Rubella: 1.68 (01/09 1612) RPR: Non Reactive (05/28 0940)  HBsAg: Negative (01/09 1612)  HIV: Non Reactive (05/28 0940)  GBS: Negative/-- (07/22 0935)  1 hr Glucola nml Genetic screening  AFP neg 18 weeks Anatomy US nml  Prenatal Transfer Tool  Maternal Diabetes: No Genetic Screening: Normal Maternal Ultrasounds/Referrals: Normal Fetal Ultrasounds or other Referrals:  None Maternal Substance Abuse:  No Significant Maternal Medications:  None Significant Maternal Lab Results:  Group B Strep negative Number of Prenatal Visits:greater than 3 verified prenatal visits Other Comments:  None  Results for orders placed or performed during the hospital encounter of 01/11/23 (from the past 24 hour(s))  Fern Test   Collection Time: 01/11/23 11:13 AM  Result Value Ref Range   POCT Fern Test Positive = ruptured amniotic membanes   Type and screen [facility]   Collection Time: 01/11/23 11:43 AM  Result Value Ref Range   ABO/RH(D) O POS    Antibody Screen NEG    Sample Expiration      01/14/2023,2359 Performed at [facility], [address], [place], [place] [address]   CBC   Collection Time: 01/11/23 12:43 PM  Result Value Ref Range   WBC 7.7 4.0 - 10.5 K/uL   RBC 3.69 (L) 3.87 - 5.11 MIL/uL   Hemoglobin 10.3 (L) 12.0 - 15.0 g/dL   HCT 29.5 (L) 62.1 - 30.8 %   MCV 83.2 80.0 - 100.0 fL   MCH 27.9 26.0 - 34.0 pg   MCHC 33.6 30.0 - 36.0 g/dL   RDW 65.7 84.6 - 96.2 %   Platelets 214 150 - 400 K/uL   nRBC 0.3 (H) 0.0 - 0.2 %    Patient Active Problem List   Diagnosis Date Noted   Pregnancy 01/11/2023   History of third degree perineal laceration 12/27/2022   History of macrosomia in infant in prior  pregnancy, currently pregnant 12/13/2022   Sickle cell trait in mother affecting pregnancy (HCC) 10/20/2022   Advanced maternal age in multigravida 10/08/2022   Supervision of other normal pregnancy, antepartum 06/08/2022   Herpes genitalis 07/15/2015    Assessment/Plan:  Teresa Burns is a 37 y.o. G2P1001 at [redacted]w[redacted]d here for SROM   #Labor: Initial CVE 3/50/-3, will start pit #Pain: Per pt request, would eventually like epidural #FWB: Cat 1 #  ID: Gbs neg #MOF: breast #MOC: patch  #Circ:  N/a  #hx possible PPH: will consider having txa ready for delivery  Kirstie Everhart, DO  01/11/2023, 1:45 PM  Attestation of CNM Supervision of Resident: Evaluation and management procedures were performed by the [facility] Medicine Resident under my supervision. I was immediately available for direct supervision, assistance and direction throughout this encounter. I also confirm that I have verified the information documented in the resident's note, and that I have also personally reperformed the pertinent components of the physical exam and all of the medical decision making activities.  I have also made any necessary editorial changes.  Patient not contracting frequently. Vertex by BSUS. Will start low dose Pitocin  Brand Males, CNM 01/11/2023 2:33 PM

## 2023-01-11 NOTE — MAU Provider Note (Signed)
Performed sterile speculum exam No HSV lesions seen on exam  Dilation: 3 Effacement (%): 50 Station: -3 Exam by:: Santhiago Collingsworth  LD team to come down for admission

## 2023-01-12 ENCOUNTER — Encounter (HOSPITAL_COMMUNITY): Payer: Self-pay | Admitting: Obstetrics and Gynecology

## 2023-01-12 DIAGNOSIS — Z3A39 39 weeks gestation of pregnancy: Secondary | ICD-10-CM

## 2023-01-12 DIAGNOSIS — O4202 Full-term premature rupture of membranes, onset of labor within 24 hours of rupture: Secondary | ICD-10-CM

## 2023-01-12 MED ORDER — SENNOSIDES-DOCUSATE SODIUM 8.6-50 MG PO TABS
2.0000 | ORAL_TABLET | Freq: Every day | ORAL | Status: DC
  Filled 2023-01-12: qty 2

## 2023-01-12 MED ORDER — WITCH HAZEL-GLYCERIN EX PADS: 1.0000 | MEDICATED_PAD | CUTANEOUS | Status: DC | PRN

## 2023-01-12 MED ORDER — SIMETHICONE 80 MG PO CHEW: 80.0000 mg | CHEWABLE_TABLET | ORAL | Status: DC | PRN

## 2023-01-12 MED ORDER — ZOLPIDEM TARTRATE 5 MG PO TABS: 5.0000 mg | ORAL_TABLET | Freq: Every evening | ORAL | Status: DC | PRN

## 2023-01-12 MED ORDER — IBUPROFEN 600 MG PO TABS
600.0000 mg | ORAL_TABLET | Freq: Four times a day (QID) | ORAL | Status: DC
  Administered 2023-01-12 – 2023-01-13 (×6): 600 mg via ORAL
  Filled 2023-01-12 (×6): qty 1

## 2023-01-12 MED ORDER — ONDANSETRON HCL 4 MG PO TABS: 4.0000 mg | ORAL_TABLET | ORAL | Status: DC | PRN

## 2023-01-12 MED ORDER — COCONUT OIL OIL: 1.0000 | TOPICAL_OIL | Status: DC | PRN

## 2023-01-12 MED ORDER — DIPHENHYDRAMINE HCL 25 MG PO CAPS
25.0000 mg | ORAL_CAPSULE | Freq: Four times a day (QID) | ORAL | Status: DC | PRN
  Administered 2023-01-12: 25 mg via ORAL
  Filled 2023-01-12: qty 1

## 2023-01-12 MED ORDER — DIBUCAINE (PERIANAL) 1 % EX OINT: 1.0000 | TOPICAL_OINTMENT | CUTANEOUS | Status: DC | PRN

## 2023-01-12 MED ORDER — PRENATAL MULTIVITAMIN CH
1.0000 | ORAL_TABLET | Freq: Every day | ORAL | Status: DC
  Administered 2023-01-12: 1 via ORAL
  Filled 2023-01-12: qty 1

## 2023-01-12 MED ORDER — ONDANSETRON HCL 4 MG/2ML IJ SOLN: 4.0000 mg | INTRAMUSCULAR | Status: DC | PRN

## 2023-01-12 MED ORDER — OXYCODONE HCL 5 MG PO TABS
5.0000 mg | ORAL_TABLET | ORAL | Status: DC | PRN
  Administered 2023-01-12 – 2023-01-13 (×2): 5 mg via ORAL
  Filled 2023-01-12 (×2): qty 1

## 2023-01-12 MED ORDER — BENZOCAINE-MENTHOL 20-0.5 % EX AERO
1.0000 | INHALATION_SPRAY | CUTANEOUS | Status: DC | PRN
  Administered 2023-01-12: 1 via TOPICAL
  Filled 2023-01-12: qty 56

## 2023-01-12 MED ORDER — ACETAMINOPHEN 325 MG PO TABS
650.0000 mg | ORAL_TABLET | ORAL | Status: DC | PRN
  Administered 2023-01-12: 650 mg via ORAL
  Filled 2023-01-12: qty 2

## 2023-01-12 NOTE — Discharge Summary (Signed)
Postpartum Discharge Summary  Date of Service updated***     Patient Name: Teresa Burns DOB: 1985-08-21 MRN: [id number]  Date of admission: 01/11/2023 Delivery date:01/12/2023 Delivering provider: Celedonio Savage Date of discharge: 01/12/2023  Admitting diagnosis: Pregnancy [Z34.90] Intrauterine pregnancy: [redacted]w[redacted]d     Secondary diagnosis:  Principal Problem:   Pregnancy Active Problems:   Herpes genitalis   Supervision of other normal pregnancy, antepartum   Advanced maternal age in multigravida   Sickle cell trait in mother affecting pregnancy (HCC)   History of macrosomia in infant in prior pregnancy, currently pregnant   History of third degree perineal laceration  Additional problems: ***    Discharge diagnosis: Term Pregnancy Delivered                                              Post partum procedures:{Postpartum procedures:23558} Augmentation: Pitocin Complications: None  Hospital course: Onset of Labor With Vaginal Delivery      37 y.o. yo G2P1001 at [redacted]w[redacted]d was admitted in Latent Labor on 01/11/2023. Labor course was uncomplicated.  Membrane Rupture Time/Date: 9:29 AM,01/11/2023  Delivery Method:Vaginal, Spontaneous Operative Delivery:N/A Episiotomy: None Lacerations:    Patient had a postpartum course complicated by ***.  She is ambulating, tolerating a regular diet, passing flatus, and urinating well. Patient is discharged home in stable condition on 01/12/23.  Newborn Data: Birth date:01/12/2023 Birth time:1:52 AM Gender:Female Living status:  Apgars: ,  Weight:   Magnesium Sulfate received: No BMZ received: No Rhophylac:N/A MMR:N/A T-DaP:{Tdap:23962} Flu: N/A Transfusion:{Transfusion received:30440034}  Physical exam  Vitals:   01/11/23 2215 01/11/23 2230 01/11/23 2300 01/11/23 2315  BP: 124/81 120/73 (!) 101/52 (!) 108/58  Pulse: (!) 114 (!) 113 (!) 108   Resp:      Temp:      TempSrc:      SpO2:      Weight:      Height:       General:  {Exam; general:21111117} Lochia: {Desc; appropriate/inappropriate:30686::"appropriate"} Uterine Fundus: {Desc; firm/soft:30687} Incision: {Exam; incision:21111123} DVT Evaluation: {Exam; dvt:2111122} Labs: Lab Results  Component Value Date   WBC 7.7 01/11/2023   HGB 10.3 (L) 01/11/2023   HCT 30.7 (L) 01/11/2023   MCV 83.2 01/11/2023   PLT 214 01/11/2023      Latest Ref Rng & Units 01/06/2023    9:00 PM  CMP  Glucose 70 - 99 mg/dL 86   BUN 6 - 20 mg/dL 6   Creatinine 6.27 - 0.35 mg/dL 0.09   Sodium 381 - 829 mmol/L 135   Potassium 3.5 - 5.1 mmol/L 3.6   Chloride 98 - 111 mmol/L 106   CO2 22 - 32 mmol/L 22   Calcium 8.9 - 10.3 mg/dL 8.4   Total Protein 6.5 - 8.1 g/dL 6.1   Total Bilirubin 0.3 - 1.2 mg/dL 0.5   Alkaline Phos 38 - 126 U/L 76   AST 15 - 41 U/L 16   ALT 0 - 44 U/L 11    Edinburgh Score:     No data to display           After visit meds:  Allergies as of 01/12/2023   No Known Allergies   Med Rec must be completed prior to using this [facility]***        Discharge home in stable condition Infant Feeding: {Baby feeding:23562} Infant Disposition:{CHL IP  OB HOME WITH BMWUXL:24401} Discharge instruction: per After Visit Summary and Postpartum booklet. Activity: Advance as tolerated. Pelvic rest for 6 weeks.  Diet: {OB UUVO:53664403} Future Appointments:No future appointments. Follow up Visit: Message sent   Please schedule this patient for a In person postpartum visit in 4 weeks with the following provider: Any provider. Additional Postpartum F/U: None   Low risk pregnancy complicated by:  None Delivery mode:  Vaginal, Spontaneous Anticipated Birth Control:   Patch   01/12/2023 Celedonio Savage, MD

## 2023-01-12 NOTE — Lactation Note (Addendum)
This note was copied from a baby's chart. Lactation Consultation Note  Patient Name: Teresa Burns [id number] Date: 01/12/2023 Age:35 hours  P2 , Initial assessment, 1st time breast feeding,  Per mom baby latched and fed 32 mins. With swallows and comfort.  Baby still awake,  nurse orientee changed a small wet diaper, and assisted mom to latch. Baby sluggish , few sucks and was latched and did not continue to suck. LC showed mom how to release the baby, Nipple well rounded. Per mom slept for 6 hours and did not feed the baby.  LC reviewed 24 breast feeding goals - feed with feeding cues and by 3 hours check diaper, change if needed and STS / attempt Latch.  Due to dry areolas , recommended nipple balm on the areola to soften and prevent breakdown.   Maternal Data    Feeding - Breast     LATCH Score Latch: Repeated attempts needed to sustain latch, nipple held in mouth throughout feeding, stimulation needed to elicit sucking reflex.  Audible Swallowing: None  Type of Nipple: Everted at rest and after stimulation (areola edema , compressible but full, LC recommended a dab of nipple cream for the swelling and dryness - preventive)  Comfort (Breast/Nipple): Soft / non-tender  Hold (Positioning): Assistance needed to correctly position infant at breast and maintain latch.  LATCH Score: 6    Interventions Interventions: Breast feeding basics reviewed;Assisted with latch;Skin to skin;Breast massage;Hand express;Reverse pressure;Breast compression;Adjust position;Support pillows;Position options;Education;LC Services brochure  Discharge Pump: Hands Free;Manual;Personal  Consult Status Consult Status: Follow-up Date: 01/12/23 Follow-up type: In-patient    Matilde Sprang Jakim Drapeau 01/12/2023, 2:16 PM

## 2023-01-13 MED ORDER — IBUPROFEN 600 MG PO TABS: 600.0000 mg | ORAL_TABLET | Freq: Four times a day (QID) | ORAL | 0 refills | Status: AC | PRN

## 2023-01-13 NOTE — Anesthesia Postprocedure Evaluation (Signed)
Anesthesia Post Note  Patient: Teresa Burns  Procedure(s) Performed: AN AD HOC LABOR EPIDURAL     Patient location during evaluation: PACU Anesthesia Type: Epidural Level of consciousness: awake and alert Pain management: pain level controlled Vital Signs Assessment: post-procedure vital signs reviewed and stable Respiratory status: spontaneous breathing, nonlabored ventilation and respiratory function stable Cardiovascular status: blood pressure returned to baseline and stable Postop Assessment: no apparent nausea or vomiting Anesthetic complications: no   No notable events documented.  Last Vitals: There were no vitals filed for this visit.  Last Pain: There were no vitals filed for this visit. Pain Goal:                   Lowella Curb

## 2023-01-13 NOTE — Lactation Note (Signed)
This note was copied from a baby's chart. Lactation Consultation Note  Patient Name: Teresa Burns [id number] Date: 01/13/2023 Age:35 hours Reason for consult: Follow-up assessment;1st time breastfeeding;Primapara;Term;Infant weight loss;Breastfeeding assistance (4 % weight loss, LC with orientee , see next entry  for Latch assessment) Per mom  breast feeding is going well and baby has been cluster feeding.  Breast are fuller and heavier.  LC reviewed sore nipple and engorgement prevention and tx.  LC reminded mom not to pull the baby off the breast and explained the proper way prevent soreness.  Orientee assisted to latch to obtain the depth and the Latch score 9.  LC praised mom for her efforts breast feeding and mom aware of the [facility] resources.     Maternal Data Has patient been taught Hand Expression?: Yes Does the patient have breastfeeding experience prior to this delivery?: No  Feeding Mother's Current Feeding Choice: Breast Milk  LATCH Score Latch: Grasps breast easily, tongue down, lips flanged, rhythmical sucking.  Audible Swallowing: Spontaneous and intermittent  Type of Nipple: Everted at rest and after stimulation  Comfort (Breast/Nipple): Soft / non-tender  Hold (Positioning): Assistance needed to correctly position infant at breast and maintain latch.  LATCH Score: 9   Lactation Tools Discussed/Used    Interventions Interventions: Breast feeding basics reviewed;Assisted with latch;Skin to skin;Breast massage;Breast compression;Adjust position;Support pillows;Education  Discharge Discharge Education: Engorgement and breast care;Warning signs for feeding baby Pump: Personal;Hands Free;Manual WIC Program: No  Consult Status Consult Status: Complete Date: 01/13/23    Kathrin Greathouse 01/13/2023, 11:12 AM

## 2023-01-14 ENCOUNTER — Inpatient Hospital Stay (HOSPITAL_COMMUNITY): Admission: AD | Admit: 2023-01-14 | Payer: Medicaid Other | Source: Home / Self Care

## 2023-01-14 ENCOUNTER — Inpatient Hospital Stay (HOSPITAL_COMMUNITY)
Admission: AD | Admit: 2023-01-14 | Payer: Medicaid Other | Source: Home / Self Care | Admitting: Obstetrics & Gynecology

## 2023-01-14 ENCOUNTER — Inpatient Hospital Stay (HOSPITAL_COMMUNITY): Payer: Medicaid Other

## 2023-01-17 ENCOUNTER — Encounter: Payer: Medicaid Other | Admitting: Advanced Practice Midwife

## 2023-01-17 ENCOUNTER — Encounter: Payer: Medicaid Other | Admitting: Obstetrics and Gynecology

## 2023-01-21 ENCOUNTER — Ambulatory Visit: Payer: Medicaid Other | Admitting: Family Medicine

## 2023-01-24 ENCOUNTER — Encounter: Payer: Medicaid Other | Admitting: Obstetrics and Gynecology

## 2023-01-26 ENCOUNTER — Encounter: Payer: Medicaid Other | Admitting: Obstetrics and Gynecology

## 2023-02-09 ENCOUNTER — Telehealth (HOSPITAL_COMMUNITY): Payer: Self-pay | Admitting: *Deleted

## 2023-02-09 NOTE — Telephone Encounter (Signed)
02/09/2023  Name: Teresa Burns MRN: [id number] DOB: September 16, 1985  Reason for Call:  Transition of Care Hospital Discharge Call  Contact Status: Patient Contact Status: Message  Language assistant needed:          Follow-Up Questions:    Inocente Salles Postnatal Depression Scale:  In the Past 7 Days:    PHQ2-9 Depression Scale:     Discharge Follow-up:    Post-discharge interventions: NA  Salena Saner, RN 02/09/2023 15:17

## 2023-02-11 ENCOUNTER — Ambulatory Visit (INDEPENDENT_AMBULATORY_CARE_PROVIDER_SITE_OTHER): Payer: Medicaid Other | Admitting: Student

## 2023-02-11 ENCOUNTER — Encounter: Payer: Self-pay | Admitting: Student

## 2023-02-11 DIAGNOSIS — Z30013 Encounter for initial prescription of injectable contraceptive: Secondary | ICD-10-CM | POA: Diagnosis not present

## 2023-02-11 DIAGNOSIS — Z3480 Encounter for supervision of other normal pregnancy, unspecified trimester: Secondary | ICD-10-CM

## 2023-02-11 MED ORDER — VITAFOL GUMMIES 3.33-0.333-34.8 MG PO CHEW
3.0000 | CHEWABLE_TABLET | Freq: Every day | ORAL | 5 refills | Status: AC
Start: 2023-02-11 — End: ?

## 2023-02-11 MED ORDER — MEDROXYPROGESTERONE ACETATE 150 MG/ML IM SUSP
150.0000 mg | Freq: Once | INTRAMUSCULAR | Status: AC
Start: 2023-02-11 — End: 2023-02-11
  Administered 2023-02-11: 150 mg via INTRAMUSCULAR

## 2023-02-11 MED ORDER — MEDROXYPROGESTERONE ACETATE 150 MG/ML IM SUSP
150.0000 mg | INTRAMUSCULAR | 4 refills | Status: AC
Start: 2023-02-11 — End: ?

## 2023-02-11 NOTE — Progress Notes (Signed)
Post Partum Visit Note  Teresa Burns is a 37 y.o. G16P2002 female who presents for a postpartum visit. She is 4 weeks postpartum following a normal spontaneous vaginal delivery.  I have fully reviewed the prenatal and intrapartum course. The delivery was at [redacted]w[redacted]d gestational weeks.  Anesthesia: epidural. Postpartum course has been well. Baby is doing well. Baby is feeding by Breast feeding. Bleeding no bleeding. Bowel function is normal. Bladder function is normal. Patient is not sexually active. Contraception method is  wants to discuss patch or depo . Postpartum depression screening: negative.   The pregnancy intention screening data noted above was reviewed. Potential methods of contraception were discussed. The patient elected to proceed with No data recorded.    Health Maintenance Due  Topic Date Due   INFLUENZA VACCINE  Never done   COVID-19 Vaccine (1 - 2023-24 season) Never done    The following portions of the patient's history were reviewed and updated as appropriate: allergies, current medications, past family history, past medical history, past social history, past surgical history, and problem list.  Review of Systems Pertinent items are noted in HPI.  Objective:  BP 94/71   Pulse 70   Wt 169 lb 3.2 oz (76.7 kg)   LMP 04/09/2022   BMI 31.97 kg/m    General:  alert, cooperative, and appears stated age   Breasts:  normal  Lungs: Normal work of breathing  Heart:  regular rate and rhythm  Abdomen: soft, non-tender; bowel sounds normal; no masses,  no organomegaly   Wound Not present  GU exam:  not indicated       Assessment:   Encounter for postpartum care of lactating mother  Initiation of Depo Provera  Normal postpartum exam.   Plan:   Essential components of care per ACOG recommendations:  1.  Mood and well being: Patient with negative depression screening today. Reviewed local resources for support.  - Patient tobacco use? No.  H/o tobacco use -  hx of drug use? No.    2. Infant care and feeding:  -Patient currently breastmilk feeding? Yes. Reviewed importance of draining breast regularly to support lactation.  -Social determinants of health (SDOH) reviewed in EPIC. No concerns  3. Sexuality, contraception and birth spacing - Patient does not want a pregnancy in the next year.  Desired family size is 2 children.  - Reviewed reproductive life planning. Reviewed contraceptive methods based on pt preferences and effectiveness.  Patient desired Hormonal Injection and Vasectomy today.  Patient also elected for Depo for cycle control.  - Discussed birth spacing of 18 months  4. Sleep and fatigue -Encouraged family/partner/community support of 4 hrs of uninterrupted sleep to help with mood and fatigue  5. Physical Recovery  - Discussed patients delivery and complications. She describes her labor as mixed. - Patient had a Vaginal, no problems at delivery. Patient had a 1st degree laceration. Perineal healing reviewed. Patient expressed understanding - Patient has urinary incontinence? Yes. Offered PT and patient declined.  - Patient is safe to resume physical and sexual activity  6.  Health Maintenance - HM due items addressed Yes - Last pap smear  Diagnosis  Date Value Ref Range Status  07/15/2022   Final   - Negative for intraepithelial lesion or malignancy (NILM)   Pap smear not done at today's visit.  -Breast Cancer screening indicated? No.   7. Chronic Disease/Pregnancy Condition follow up: None  - PCP follow up  Corlis Hove, NP [facility], [facility]

## 2023-06-08 ENCOUNTER — Ambulatory Visit
Admission: EM | Admit: 2023-06-08 | Discharge: 2023-06-08 | Disposition: A | Discharge status: Home / Self Care | Payer: Medicaid Other | Attending: Family Medicine | Admitting: Family Medicine

## 2023-06-08 DIAGNOSIS — J018 Other acute sinusitis: Secondary | ICD-10-CM

## 2023-06-08 MED ORDER — PROMETHAZINE-DM 6.25-15 MG/5ML PO SYRP: 5.0000 mL | ORAL_SOLUTION | Freq: Three times a day (TID) | ORAL | 0 refills | Status: AC | PRN

## 2023-06-08 MED ORDER — AMOXICILLIN-POT CLAVULANATE 875-125 MG PO TABS: 1.0000 | ORAL_TABLET | Freq: Two times a day (BID) | ORAL | 0 refills | Status: AC

## 2023-06-08 MED ORDER — PSEUDOEPHEDRINE HCL 60 MG PO TABS: 60.0000 mg | ORAL_TABLET | Freq: Three times a day (TID) | ORAL | 0 refills | Status: AC | PRN

## 2023-06-08 MED ORDER — CETIRIZINE HCL 10 MG PO TABS: 10.0000 mg | ORAL_TABLET | Freq: Every day | ORAL | 0 refills | Status: AC

## 2023-06-08 NOTE — ED Triage Notes (Addendum)
Pt states cough and congestion for the past week.

## 2023-06-08 NOTE — Discharge Instructions (Signed)
 We will manage this as a sinus infection with Augmentin. For sore throat or cough try using a honey-based tea. Use 3 teaspoons of honey with juice squeezed from half lemon. Place shaved pieces of ginger into 1/2-1 cup of water and warm over stove top. Then mix the ingredients and repeat every 4 hours as needed. Please take ibuprofen 600mg  every 6 hours with food alternating with OR taken together with Tylenol 500mg -650mg  every 6 hours for throat pain, fevers, aches and pains. Hydrate very well with at least 2 liters of water. Eat light meals such as soups (chicken and noodles, vegetable, chicken and wild rice).  Do not eat foods that you are allergic to.  Taking an antihistamine like Zyrtec can help against postnasal drainage, sinus congestion which can cause sinus pain, sinus headaches, throat pain, painful swallowing, coughing.  You can take this together with pseudoephedrine (Sudafed) at a dose of 60 mg 3 times a day or twice daily as needed for the same kind of nasal drip, congestion.  Use cough medication as needed.

## 2023-06-08 NOTE — ED Provider Notes (Signed)
 [facility]  Note:  This document was prepared using Conservation officer, historic buildings and may include unintentional dictation errors.  MRN: [id number] DOB: [birth year]  Subjective:   Teresa Burns is a 38 y.o. female presenting for 2 week history of persistent sinus congestion, coughing, sinus drainage, and a few episodes of posttussive emesis.  Last week she had a couple of days of interim improvement before her symptoms returned aggressively.  Had exposure to community-acquired pneumonia with 2 family members.  Patient quit smoking a few months ago.  No history of asthma.  No current facility-administered medications for this encounter.  Current Outpatient Medications:    ibuprofen  (ADVIL ) 600 MG tablet, Take 1 tablet (600 mg total) by mouth every 6 (six) hours as needed., Disp: 30 tablet, Rfl: 0   medroxyPROGESTERone  (DEPO-PROVERA ) 150 MG/ML injection, Inject 1 mL (150 mg total) into the muscle every 3 (three) months., Disp: 1 mL, Rfl: 4   Prenatal Vit-Fe Phos-FA-Omega (VITAFOL  GUMMIES) 3.33-0.333-34.8 MG CHEW, Chew 3 tablets by mouth daily., Disp: 90 tablet, Rfl: 5   No Known Allergies  Past Medical History:  Diagnosis Date   Advanced maternal age in multigravida 10/08/2022   Blood transfusion without reported diagnosis    PPH last delivery per pt   Headache    Herpes    History of macrosomia in infant in prior pregnancy, currently pregnant 12/13/2022   History of third degree perineal laceration 12/27/2022   Pleuritic chest pain 07/15/2015   Pregnancy 01/11/2023   Sickle cell trait (HCC)    Sickle cell trait in mother affecting pregnancy (HCC) 10/20/2022   Supervision of other normal pregnancy, antepartum 06/08/2022              Nursing Staff    Provider      Office Location    [facility]    Dating     01/14/2023, by Last Menstrual Period      [facility] Model    GALERIUS.GANT ] Traditional  [ ]  Centering  [ ]  Mom-Baby Dyad                Language     English    Anatomy US              Flu Vaccine      No 2023/24    Genetic/Carrier Screen     NIPS:     AFP:   negative 18 weeks  Horizon:      TDaP Vaccine      Declines 11/25/22    Hgb A1     Past Surgical History:  Procedure Laterality Date   NO PAST SURGERIES      Family History  Problem Relation Age of Onset   Hypertension Mother    Bipolar disorder Mother    Stroke Maternal Grandmother    Diabetes Maternal Grandmother    Hypertension Maternal Grandmother    Cancer Maternal Grandmother     Social History   Tobacco Use   Smoking status: Former    Current packs/day: 0.00    Types: Cigarettes    Quit date: 05/16/2022    Years since quitting: 1.0   Smokeless tobacco: Never   Tobacco comments:    1/2PPD  Vaping Use   Vaping status: Never Used  Substance Use Topics   Alcohol use: Not Currently    Comment: pt states 8 to 10 shots nightly   Drug use: Not Currently    Types: Marijuana    Comment:  last use 05/16/22    ROS   Objective:   Vitals: BP 123/85 (BP Location: Left Arm)   Pulse 92   Temp 98.7 F (37.1 C) (Oral)   Resp 16   SpO2 96%   Breastfeeding No   Physical Exam Constitutional:      General: She is not in acute distress.    Appearance: Normal appearance. She is well-developed and normal weight. She is not ill-appearing, toxic-appearing or diaphoretic.  HENT:     Head: Normocephalic and atraumatic.     Right Ear: Tympanic membrane, ear canal and external ear normal. No drainage or tenderness. No middle ear effusion. There is no impacted cerumen. Tympanic membrane is not erythematous or bulging.     Left Ear: Tympanic membrane, ear canal and external ear normal. No drainage or tenderness.  No middle ear effusion. There is no impacted cerumen. Tympanic membrane is not erythematous or bulging.     Nose: Congestion present. No rhinorrhea.     Mouth/Throat:     Mouth: Mucous membranes are moist. No oral lesions.     Pharynx: Posterior oropharyngeal erythema present. No pharyngeal  swelling, oropharyngeal exudate or uvula swelling.     Tonsils: No tonsillar exudate or tonsillar abscesses.  Eyes:     General: No scleral icterus.       Right eye: No discharge.        Left eye: No discharge.     Extraocular Movements: Extraocular movements intact.     Right eye: Normal extraocular motion.     Left eye: Normal extraocular motion.     Conjunctiva/sclera: Conjunctivae normal.  Cardiovascular:     Rate and Rhythm: Normal rate and regular rhythm.     Heart sounds: Normal heart sounds. No murmur heard.    No friction rub. No gallop.  Pulmonary:     Effort: Pulmonary effort is normal. No respiratory distress.     Breath sounds: No stridor. No wheezing, rhonchi or rales.  Chest:     Chest wall: No tenderness.  Musculoskeletal:     Cervical back: Normal range of motion and neck supple.  Lymphadenopathy:     Cervical: No cervical adenopathy.  Skin:    General: Skin is warm and dry.  Neurological:     General: No focal deficit present.     Mental Status: She is alert and oriented to person, place, and time.  Psychiatric:        Mood and Affect: Mood normal.        Behavior: Behavior normal.     Assessment and Plan :   PDMP not reviewed this encounter.  1. Acute non-recurrent sinusitis of other sinus    Deferred imaging given clear cardiopulmonary exam, hemodynamically stable vital signs.  Will start empiric treatment for sinusitis with Augmentin .  Recommended supportive care otherwise. Counseled patient on potential for adverse effects with medications prescribed/recommended today, ER and return-to-clinic precautions discussed, patient verbalized understanding.    Christopher Savannah, [place] 06/08/23 1719

## 2024-02-15 ENCOUNTER — Ambulatory Visit

## 2024-02-15 ENCOUNTER — Ambulatory Visit
Admission: EM | Admit: 2024-02-15 | Discharge: 2024-02-15 | Disposition: A | Discharge status: Home / Self Care | Attending: Family Medicine | Admitting: Family Medicine

## 2024-02-15 DIAGNOSIS — S90111A Contusion of right great toe without damage to nail, initial encounter: Secondary | ICD-10-CM | POA: Diagnosis not present

## 2024-02-15 MED ORDER — NAPROXEN 500 MG PO TABS: 500.0000 mg | ORAL_TABLET | Freq: Two times a day (BID) | ORAL | 0 refills | Status: AC

## 2024-02-15 NOTE — ED Triage Notes (Signed)
 Pt states she hit right great toe on concrete 6am-no pain meds PTA-NAD-limping gait

## 2024-02-15 NOTE — ED Provider Notes (Signed)
 [facility]  Note:  This document was prepared using Conservation officer, historic buildings and may include unintentional dictation errors.  MRN: [id number] DOB: 06/17/1985  Subjective:   Teresa Burns is a 38 y.o. female presenting for stubbing her right great toe against a concrete curb this morning.  Has had severe pain.  Has not taken medications prior to coming into the clinic.  No current facility-administered medications for this encounter.  Current Outpatient Medications:    amoxicillin -clavulanate (AUGMENTIN ) 875-125 MG tablet, Take 1 tablet by mouth 2 (two) times daily., Disp: 14 tablet, Rfl: 0   cetirizine  (ZYRTEC  ALLERGY) 10 MG tablet, Take 1 tablet (10 mg total) by mouth daily., Disp: 30 tablet, Rfl: 0   ibuprofen  (ADVIL ) 600 MG tablet, Take 1 tablet (600 mg total) by mouth every 6 (six) hours as needed., Disp: 30 tablet, Rfl: 0   medroxyPROGESTERone  (DEPO-PROVERA ) 150 MG/ML injection, Inject 1 mL (150 mg total) into the muscle every 3 (three) months., Disp: 1 mL, Rfl: 4   Prenatal Vit-Fe Phos-FA-Omega (VITAFOL  GUMMIES) 3.33-0.333-34.8 MG CHEW, Chew 3 tablets by mouth daily., Disp: 90 tablet, Rfl: 5   promethazine -dextromethorphan (PROMETHAZINE -DM) 6.25-15 MG/5ML syrup, Take 5 mLs by mouth 3 (three) times daily as needed for cough., Disp: 200 mL, Rfl: 0   pseudoephedrine  (SUDAFED) 60 MG tablet, Take 1 tablet (60 mg total) by mouth every 8 (eight) hours as needed for congestion., Disp: 30 tablet, Rfl: 0   No Known Allergies  Past Medical History:  Diagnosis Date   Advanced maternal age in multigravida 10/08/2022   Blood transfusion without reported diagnosis    PPH last delivery per pt   Headache    Herpes    History of macrosomia in infant in prior pregnancy, currently pregnant 12/13/2022   History of third degree perineal laceration 12/27/2022   Pleuritic chest pain 07/15/2015   Pregnancy 01/11/2023   Sickle cell trait (HCC)    Sickle cell trait  in mother affecting pregnancy (HCC) 10/20/2022   Supervision of other normal pregnancy, antepartum 06/08/2022              Nursing Staff    Provider      Office Location    [facility]    Dating     01/14/2023, by Last Menstrual Period      [facility] Model    Galerius.Gant ] Traditional  [ ]  Centering  [ ]  Mom-Baby Dyad                Language     English    Anatomy US             Flu Vaccine      No 2023/24    Genetic/Carrier Screen     NIPS:     AFP:   negative 18 weeks  Horizon:      TDaP Vaccine      Declines 11/25/22    Hgb A1     Past Surgical History:  Procedure Laterality Date   NO PAST SURGERIES      Family History  Problem Relation Age of Onset   Hypertension Mother    Bipolar disorder Mother    Stroke Maternal Grandmother    Diabetes Maternal Grandmother    Hypertension Maternal Grandmother    Cancer Maternal Grandmother     Social History   Tobacco Use   Smoking status: Former    Current packs/day: 0.00    Types: Cigarettes    Quit date:  05/16/2022    Years since quitting: 1.7   Smokeless tobacco: Never   Tobacco comments:    1/2PPD  Vaping Use   Vaping status: Never Used  Substance Use Topics   Alcohol use: Not Currently   Drug use: Not Currently    Types: Marijuana    ROS   Objective:   Vitals: BP 113/82 (BP Location: Right Arm)   Pulse 79   Temp 98.7 F (37.1 C) (Oral)   Resp 20   LMP 02/12/2024   SpO2 96%   Physical Exam Constitutional:      General: She is not in acute distress.    Appearance: Normal appearance. She is well-developed. She is not ill-appearing, toxic-appearing or diaphoretic.  HENT:     Head: Normocephalic and atraumatic.     Nose: Nose normal.     Mouth/Throat:     Mouth: Mucous membranes are moist.  Eyes:     General: No scleral icterus.       Right eye: No discharge.        Left eye: No discharge.     Extraocular Movements: Extraocular movements intact.  Cardiovascular:     Rate and Rhythm: Normal rate.  Pulmonary:     Effort: Pulmonary  effort is normal.  Musculoskeletal:       Feet:  Skin:    General: Skin is warm and dry.  Neurological:     General: No focal deficit present.     Mental Status: She is alert and oriented to person, place, and time.  Psychiatric:        Mood and Affect: Mood normal.        Behavior: Behavior normal.    DG Toe Great Right Result Date: 02/15/2024 CLINICAL DATA:  Injury, pain EXAM: RIGHT GREAT TOE COMPARISON:  None Available. FINDINGS: There is no evidence of fracture or dislocation. There is no evidence of arthropathy or other focal bone abnormality. Soft tissues are unremarkable. IMPRESSION: No acute or significant finding by plain radiography Electronically Signed   By: CHRISTELLA.  Shick M.D.   On: 02/15/2024 11:23     Assessment and Plan :   PDMP not reviewed this encounter.  1. Contusion of right great toe without damage to nail, initial encounter    Recommended conservative management for right great toe contusion.  Use RICE method, naproxen  for pain and inflammation. Counseled patient on potential for adverse effects with medications prescribed/recommended today, ER and return-to-clinic precautions discussed, patient verbalized understanding.    Christopher Savannah, PA-C 02/15/24 1228
